# Patient Record
Sex: Female | Born: 1935 | Race: White | Hispanic: No | Marital: Married | Smoking: Never smoker
Health system: Southern US, Community
[De-identification: ages and names within clinical notes are randomized; demographics above are authoritative.]

## PROBLEM LIST (undated history)

## (undated) DIAGNOSIS — I350 Nonrheumatic aortic (valve) stenosis: Secondary | ICD-10-CM

## (undated) DIAGNOSIS — I359 Nonrheumatic aortic valve disorder, unspecified: Secondary | ICD-10-CM

## (undated) DIAGNOSIS — I779 Disorder of arteries and arterioles, unspecified: Secondary | ICD-10-CM

## (undated) DIAGNOSIS — I10 Essential (primary) hypertension: Secondary | ICD-10-CM

## (undated) DIAGNOSIS — R42 Dizziness and giddiness: Secondary | ICD-10-CM

## (undated) DIAGNOSIS — E785 Hyperlipidemia, unspecified: Secondary | ICD-10-CM

## (undated) DIAGNOSIS — N183 Chronic kidney disease, stage 3 unspecified: Secondary | ICD-10-CM

## (undated) DIAGNOSIS — F329 Major depressive disorder, single episode, unspecified: Secondary | ICD-10-CM

## (undated) DIAGNOSIS — K219 Gastro-esophageal reflux disease without esophagitis: Secondary | ICD-10-CM

## (undated) DIAGNOSIS — I739 Peripheral vascular disease, unspecified: Secondary | ICD-10-CM

## (undated) DIAGNOSIS — F32A Depression, unspecified: Secondary | ICD-10-CM

## (undated) DIAGNOSIS — E539 Vitamin B deficiency, unspecified: Secondary | ICD-10-CM

## (undated) DIAGNOSIS — I701 Atherosclerosis of renal artery: Secondary | ICD-10-CM

## (undated) DIAGNOSIS — I672 Cerebral atherosclerosis: Secondary | ICD-10-CM

## (undated) DIAGNOSIS — I6529 Occlusion and stenosis of unspecified carotid artery: Secondary | ICD-10-CM

## (undated) HISTORY — DX: Depression, unspecified: F32.A

## (undated) HISTORY — DX: Chronic kidney disease, stage 3 unspecified: N18.30

## (undated) HISTORY — DX: Nonrheumatic aortic (valve) stenosis: I35.0

## (undated) HISTORY — DX: Vitamin B deficiency, unspecified: E53.9

## (undated) HISTORY — DX: Chronic kidney disease, stage 3 (moderate): N18.3

## (undated) HISTORY — DX: Nonrheumatic aortic valve disorder, unspecified: I35.9

## (undated) HISTORY — DX: Peripheral vascular disease, unspecified: I73.9

## (undated) HISTORY — DX: Major depressive disorder, single episode, unspecified: F32.9

## (undated) HISTORY — PX: APPENDECTOMY: SHX54

## (undated) HISTORY — DX: Dizziness and giddiness: R42

## (undated) HISTORY — DX: Gastro-esophageal reflux disease without esophagitis: K21.9

## (undated) HISTORY — PX: CHOLECYSTECTOMY: SHX55

## (undated) HISTORY — DX: Atherosclerosis of renal artery: I70.1

## (undated) HISTORY — DX: Disorder of arteries and arterioles, unspecified: I77.9

## (undated) HISTORY — PX: ABDOMINAL HYSTERECTOMY: SHX81

## (undated) HISTORY — DX: Occlusion and stenosis of unspecified carotid artery: I65.29

## (undated) HISTORY — DX: Essential (primary) hypertension: I10

## (undated) HISTORY — DX: Hyperlipidemia, unspecified: E78.5

## (undated) HISTORY — DX: Cerebral atherosclerosis: I67.2

---

## 2004-07-30 ENCOUNTER — Ambulatory Visit (HOSPITAL_COMMUNITY): Admission: RE | Admit: 2004-07-30 | Discharge: 2004-07-31 | Payer: Self-pay | Admitting: Ophthalmology

## 2011-07-21 ENCOUNTER — Other Ambulatory Visit: Payer: Self-pay | Admitting: Family Medicine

## 2011-07-21 DIAGNOSIS — I10 Essential (primary) hypertension: Secondary | ICD-10-CM

## 2011-07-21 DIAGNOSIS — N289 Disorder of kidney and ureter, unspecified: Secondary | ICD-10-CM

## 2011-07-24 ENCOUNTER — Ambulatory Visit
Admission: RE | Admit: 2011-07-24 | Discharge: 2011-07-24 | Disposition: A | Discharge status: Home / Self Care | Payer: Medicare Other | Source: Ambulatory Visit | Attending: Family Medicine | Admitting: Family Medicine

## 2011-07-24 ENCOUNTER — Other Ambulatory Visit: Payer: Self-pay | Admitting: Family Medicine

## 2011-07-24 DIAGNOSIS — I10 Essential (primary) hypertension: Secondary | ICD-10-CM

## 2011-07-24 DIAGNOSIS — R110 Nausea: Secondary | ICD-10-CM

## 2011-07-24 DIAGNOSIS — N289 Disorder of kidney and ureter, unspecified: Secondary | ICD-10-CM

## 2011-07-27 ENCOUNTER — Ambulatory Visit
Admission: RE | Admit: 2011-07-27 | Discharge: 2011-07-27 | Disposition: A | Discharge status: Home / Self Care | Payer: Medicare Other | Source: Ambulatory Visit | Attending: Family Medicine | Admitting: Family Medicine

## 2011-07-27 ENCOUNTER — Other Ambulatory Visit: Payer: Self-pay | Admitting: Family Medicine

## 2011-07-27 DIAGNOSIS — R110 Nausea: Secondary | ICD-10-CM

## 2011-07-27 MED ORDER — IOHEXOL 300 MG/ML  SOLN
100.0000 mL | Freq: Once | INTRAMUSCULAR | Status: AC | PRN
  Administered 2011-07-27: 100 mL via INTRAVENOUS

## 2013-11-30 ENCOUNTER — Other Ambulatory Visit: Payer: Self-pay | Admitting: Family Medicine

## 2013-11-30 DIAGNOSIS — I739 Peripheral vascular disease, unspecified: Secondary | ICD-10-CM

## 2013-11-30 DIAGNOSIS — I779 Disorder of arteries and arterioles, unspecified: Secondary | ICD-10-CM

## 2013-11-30 DIAGNOSIS — G44009 Cluster headache syndrome, unspecified, not intractable: Secondary | ICD-10-CM

## 2013-12-11 ENCOUNTER — Ambulatory Visit
Admission: RE | Admit: 2013-12-11 | Discharge: 2013-12-11 | Disposition: A | Discharge status: Home / Self Care | Payer: Medicare Other | Source: Ambulatory Visit | Attending: Family Medicine | Admitting: Family Medicine

## 2013-12-11 DIAGNOSIS — I739 Peripheral vascular disease, unspecified: Secondary | ICD-10-CM

## 2013-12-11 DIAGNOSIS — I779 Disorder of arteries and arterioles, unspecified: Secondary | ICD-10-CM

## 2013-12-11 DIAGNOSIS — G44009 Cluster headache syndrome, unspecified, not intractable: Secondary | ICD-10-CM

## 2014-06-12 ENCOUNTER — Encounter: Payer: Self-pay | Admitting: *Deleted

## 2015-08-28 ENCOUNTER — Encounter: Payer: Self-pay | Admitting: Neurology

## 2015-08-28 ENCOUNTER — Ambulatory Visit (INDEPENDENT_AMBULATORY_CARE_PROVIDER_SITE_OTHER): Payer: Medicare Other | Admitting: Neurology

## 2015-08-28 VITALS — BP 118/80 | HR 80 | Ht 59.0 in | Wt 131.6 lb

## 2015-08-28 DIAGNOSIS — G3184 Mild cognitive impairment, so stated: Secondary | ICD-10-CM

## 2015-08-28 NOTE — Progress Notes (Signed)
NEUROLOGY CONSULTATION NOTE  Kaitlyn Clark MRN: [id number] DOB: 16-Oct-1924  Referring provider: Dr. Stephanie Acre Primary care provider: Dr. Stephanie Acre  Reason for consult:  dementia  HISTORY OF PRESENT ILLNESS: Kaitlyn Clark is an 82 year old right-handed female with chronic kidney disease, hyperlipidemia, depression, renal artery stenosis, CAD, hypertension who presents for dementia.  History obtained by patient and daughter.  Labs and imaging of brain MRI reviewed.  She went to school through the 11th grade.  Over the past couple of years, she has had short-term memory problems.  She often misplaces objects, such as money or her insurance cards.  Sometimes, she was reportedly mixing up her medications.  While her daughter pays most of the bills (such as the utility bills), she still pays the mortgage and the [employer].  She maybe missed a payment only once.  She was still driving up until this past Fall, mostly because she doesn't feel well.  Usually she has family that drives her to get groceries.  One morning, she called her daughter asking where she was.  She told her daughter she had prepared breakfast for her but she had left.  Apparently, she got confused and remembered that her daughter had visited her the previous week.  She currently lives in her own home.  However, her other daughter lives in the area.  Her son-in-law comes by the house everyday to help out..  Her grandson stays with her at night.  She performs all of her ADLs, including dressing and bathing.  She is not incontinent.  She requires help cleaning the house due to generalized weakness.  She cooks a little bit and never leaves on the stove.  Her sleep is poor, but she is not confused or combative and does not wander out of the house.  She does take short walks around the neighborhood for exercise without getting lost.  There is no family history of dementia.  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  She receives B12 shots.  B12 from 07/10/14 was 656.  LDL was 139.  TSH from 11/30/13 was 1.43.   Carotid doppler from 12/11/13 showed atherosclerotic plaque in both carotid bifurcations but no hemodynamically significant stenosis.    She was told she should no longer drive.  PAST MEDICAL HISTORY: Past Medical History  Diagnosis Date  . Hypertension   . GERD (gastroesophageal reflux disease)   . CKD (chronic kidney disease), stage III   . Depression   . Aortic stenosis, mild   . Carotid artery stenosis     bilateral  . Vertigo   . Aortic valve disorder   . Hyperlipidemia   . Renal artery stenosis ([place])   . Cerebral atherosclerosis   . Carotid arterial disease ([place])   . Vitamin B deficiency     PAST SURGICAL HISTORY: No past surgical history on file.  MEDICATIONS: Current Outpatient Prescriptions on File Prior to Visit  Medication Sig Dispense Refill  . aspirin 81 MG tablet Take 81 mg by mouth daily.    . hydrochlorothiazide (MICROZIDE) 12.5 MG capsule Take 12.5 mg by mouth daily.    . MULTIPLE VITAMIN PO Take by mouth daily.     No current facility-administered medications on file prior to visit.    ALLERGIES: No Known Allergies  FAMILY  HISTORY: Family History  Problem Relation Age of Onset  . Heart attack Father   . Cancer Father   . CAD Father   . Colon cancer Neg Hx   . Colon polyps Neg Hx   . Liver disease Neg Hx     SOCIAL HISTORY: Social History   Social History  . Marital Status: Married    Spouse Name: N/A  . Number of Children: N/A  . Years of Education: N/A   Occupational History  . Not on file.   Social History Main Topics  . Smoking status: Never Smoker   . Smokeless tobacco: Never Used  . Alcohol Use: 0.0 oz/week    0 Standard drinks or equivalent per  week  . Drug Use: No  . Sexual Activity: Not on file   Other Topics Concern  . Not on file   Social History Narrative    REVIEW OF SYSTEMS: Constitutional: No fevers, chills, or sweats, no generalized fatigue, change in appetite Eyes: No visual changes, double vision, eye pain Ear, nose and throat: No hearing loss, ear pain, nasal congestion, sore throat Cardiovascular: No chest pain, palpitations Respiratory:  No shortness of breath at rest or with exertion, wheezes GastrointestinaI: No nausea, vomiting, diarrhea, abdominal pain, fecal incontinence Genitourinary:  No dysuria, urinary retention or frequency Musculoskeletal:  No neck pain, back pain Integumentary: No rash, pruritus, skin lesions Neurological: as above Psychiatric: No depression, insomnia, anxiety Endocrine: No palpitations, fatigue, diaphoresis, mood swings, change in appetite, change in weight, increased thirst Hematologic/Lymphatic:  No anemia, purpura, petechiae. Allergic/Immunologic: no itchy/runny eyes, nasal congestion, recent allergic reactions, rashes  PHYSICAL EXAM: Filed Vitals:   08/28/15 1436  BP: 118/80  Pulse: 80   General: No acute distress.  Patient appears well-groomed.  Head:  Normocephalic/atraumatic Eyes:  fundi examined but not visualized Neck: supple, no paraspinal tenderness, full range of motion Back: No paraspinal tenderness Heart: regular rate and rhythm Lungs: Clear to auscultation bilaterally. Vascular: No carotid bruits. Neurological Exam: Mental status: alert and oriented to person and place but not time (except month), recent memory poor, remote memory intact, fund of knowledge fair, attention and concentration intact, speech fluent and not dysarthric, language intact. Montreal Cognitive Assessment  08/28/2015  Visuospatial/ Executive (0/5) 2  Naming (0/3) 2  Attention: Read list of digits (0/2) 2  Attention: Read list of letters (0/1) 1  Attention: Serial 7 subtraction  starting at 100 (0/3) 2  Language: Repeat phrase (0/2) 0  Language : Fluency (0/1) 0  Abstraction (0/2) 1  Delayed Recall (0/5) 0  Orientation (0/6) 3  Total 13  Adjusted Score (based on education) 14   Cranial nerves: CN I: not tested CN II: pupils equal, round and reactive to light, visual fields intact CN III, IV, VI:  full range of motion, no nystagmus, no ptosis CN V: facial sensation intact CN VII: upper and lower face symmetric CN VIII: hearing intact CN IX, X: gag intact, uvula midline CN XI: sternocleidomastoid and trapezius muscles intact CN XII: tongue midline Bulk & Tone: normal, no fasciculations. Motor:  5/5 throughout  Sensation: temperature and vibration sensation intact. Deep Tendon Reflexes:  2+ throughout, toes downgoing. Finger to nose testing:  Without dysmetria.  Heel to shin:  Without dysmetria.  Gait:  Unsteady without walker.  Able to turn and unable to tandem walk. Romberg with sway.  IMPRESSION: Possible mild cognitive impairment, but I wouldn't start any disease modifying medication such as Aricept or Namenda as I feel side  effects would outweigh benefits.  Although she has some lapse in memory, including the episode when she thought her daughter was in the house, but she is not combative or agitated.  Safety measures seem to be in place at home.  She has family around her constantly, who take her shopping and monitor her medication.  I agree that she should not drive.  She would like to undergo formal occupational driving evaluation, and we will provide her information on somebody.  Otherwise, follow up as needed.  Thank you for allowing me to take part in the care of this patient.  Metta Clines, DO  CC: Jonathon Jordan, MD

## 2015-08-28 NOTE — Patient Instructions (Signed)
I think you may have some mild cognitive impairment, but I don't appreciate any evidence of a progressive disease such as Alzheimer's.  I certainly wouldn't start any medication specifically for dementia.  The main thing is to focus on addressing all safety measures, such as 24 hour supervision at home.  Medication should be monitored.  I would try to keep socially active and stimulated, engaging with others.  I would continue regular walks.  I agree that you should not be driving but you should get a formal driving evaluation.  We will provide you with the number and information for one occupational therapist driving evaluator.  Call with any questions or concerns.

## 2015-08-29 NOTE — Progress Notes (Signed)
Chart forwarded.  

## 2016-01-15 DIAGNOSIS — I10 Essential (primary) hypertension: Secondary | ICD-10-CM

## 2016-01-15 HISTORY — DX: Essential (primary) hypertension: I10

## 2019-03-06 ENCOUNTER — Encounter: Payer: Self-pay | Admitting: *Deleted

## 2019-03-09 ENCOUNTER — Encounter: Payer: Self-pay | Admitting: Radiation Oncology

## 2019-03-09 NOTE — Progress Notes (Signed)
Histology and Location of Primary Skin Cancer:  01/26/19   Kaitlyn Clark presented with the following signs/symptoms: Itching and irritation.   Past/Anticipated interventions by patient's surgeon/dermatologist for current problematic lesion, if any:  She was referred for Moh's procedure on 01/26/19 by [facility].  03/02/19 Dr. Winifred Olive   Past skin cancers, if any: Her daughter denies.   1) Location/Histology/Intervention:   2) Location/Histology/Intervention:   3) Location/Histology/Intervention:   History of Blistering sunburns, if any: Yes  SAFETY ISSUES:  Prior radiation? No  Pacemaker/ICD? No  Possible current pregnancy? No  Is the patient on methotrexate? No  Current Complaints / other details:   She is currently living with her daughter in [place]. She is supposed to move with her other daughter in [place] for  8-10 weeks (they alternate caring for their mother)

## 2019-03-14 ENCOUNTER — Ambulatory Visit
Admission: RE | Admit: 2019-03-14 | Discharge: 2019-03-14 | Disposition: A | Discharge status: Home / Self Care | Payer: Medicare Other | Source: Ambulatory Visit | Attending: Radiation Oncology | Admitting: Radiation Oncology

## 2019-03-14 ENCOUNTER — Encounter: Payer: Self-pay | Admitting: *Deleted

## 2019-03-14 ENCOUNTER — Other Ambulatory Visit: Payer: Self-pay

## 2019-03-14 ENCOUNTER — Encounter: Payer: Self-pay | Admitting: Radiation Oncology

## 2019-03-14 ENCOUNTER — Telehealth: Payer: Self-pay | Admitting: *Deleted

## 2019-03-14 VITALS — BP 152/59 | HR 62 | Temp 98.3°F | Resp 18 | Wt 131.5 lb

## 2019-03-14 DIAGNOSIS — Z8249 Family history of ischemic heart disease and other diseases of the circulatory system: Secondary | ICD-10-CM | POA: Insufficient documentation

## 2019-03-14 DIAGNOSIS — Z79899 Other long term (current) drug therapy: Secondary | ICD-10-CM | POA: Insufficient documentation

## 2019-03-14 DIAGNOSIS — F0390 Unspecified dementia without behavioral disturbance: Secondary | ICD-10-CM | POA: Insufficient documentation

## 2019-03-14 DIAGNOSIS — N183 Chronic kidney disease, stage 3 unspecified: Secondary | ICD-10-CM | POA: Insufficient documentation

## 2019-03-14 DIAGNOSIS — E785 Hyperlipidemia, unspecified: Secondary | ICD-10-CM | POA: Diagnosis not present

## 2019-03-14 DIAGNOSIS — E538 Deficiency of other specified B group vitamins: Secondary | ICD-10-CM | POA: Diagnosis not present

## 2019-03-14 DIAGNOSIS — C4441 Basal cell carcinoma of skin of scalp and neck: Secondary | ICD-10-CM

## 2019-03-14 DIAGNOSIS — Z7982 Long term (current) use of aspirin: Secondary | ICD-10-CM | POA: Insufficient documentation

## 2019-03-14 DIAGNOSIS — I10 Essential (primary) hypertension: Secondary | ICD-10-CM | POA: Diagnosis not present

## 2019-03-14 DIAGNOSIS — Z90710 Acquired absence of both cervix and uterus: Secondary | ICD-10-CM | POA: Insufficient documentation

## 2019-03-14 HISTORY — DX: Basal cell carcinoma of skin of scalp and neck: C44.41

## 2019-03-14 NOTE — Progress Notes (Signed)
Radiation Oncology         [phone] ________________________________  Initial outpatient Consultation in person  Name: Kaitlyn Clark MRN: [id number]  Date: 03/14/2019  DOB: 09-23-1924  CC:Jonathon Jordan, MD  Karin Golden, MD   REFERRING PHYSICIAN: Karin Golden, MD  DIAGNOSIS:    ICD-10-CM   1. Basal cell carcinoma (BCC) of scalp  C44.41   2. Basal cell carcinoma of scalp  C44.41   Cancer Staging Basal cell carcinoma of scalp Staging form: Cutaneous Carcinoma of the Head and Neck, AJCC 8th Edition - Clinical stage from 03/14/2019: Stage III (cT3, cN0, cM0) - Signed by Eppie Gibson, MD on 03/14/2019   CHIEF COMPLAINT: Here to discuss management of skin cancer  HISTORY OF PRESENT ILLNESS::Kaitlyn Clark is a 85 y.o. female who presented with recurrent scalp lesion. She has had the lesion for approximately 8 years. It was biopsied in 07/2014 and showed basal cell carcinoma. At that time, the lesion was treated with topicort spray. Per [employer], PA-C's note, she was treated with LN2 on 10/16/2016, 01/12/2017, and 02/01/2018. Repeat biopsy performed on 01/26/2019 showed basal cell carcinoma, nodular pattern, ulcerated, deep margin involved. She was referred for Moh's.  In light of the patient's dementia, her daughter Vaughan Basta, who is her POA, has been the primary Media planner. Vaughan Basta expressed concern for the patient's ability to tolerate Moh's and opted to pursue radiation treatment.  Of note the patient also underwent shave biopsy of the left tip of her nose in the end of September - January 26, 2019 and does not know of any follow-up yet with dermatology although it appears that she was to be seen for follow-up in 6 weeks after the procedure.  This was conducted by Dr. Juel Burrow physicians assistant.  See pathology report below   The patient has: Itching and irritation over her scalp.  Due to her dementia she will repeatedly itch or even rub her hair brush over her scalp  which irritates it further  The patient's 3 daughters take turns caring for her every several weeks.  The patient is tentatively going to [facility] later this month to be with her other daughter  PREVIOUS RADIATION THERAPY: No  PAST MEDICAL HISTORY:  has a past medical history of Aortic stenosis, mild, Aortic valve disorder, Carotid arterial disease ([place]), Carotid artery stenosis, Cerebral atherosclerosis, CKD (chronic kidney disease), stage III, Depression, GERD (gastroesophageal reflux disease), Hyperlipidemia, Hypertension, Renal artery stenosis ([place]), Vertigo, and Vitamin B deficiency.    PAST SURGICAL HISTORY: Past Surgical History:  Procedure Laterality Date  . ABDOMINAL HYSTERECTOMY    . APPENDECTOMY    . CHOLECYSTECTOMY      FAMILY HISTORY: family history includes CAD in her father; Cancer in her father; Heart attack in her father.  SOCIAL HISTORY:  reports that she has never smoked. She has never used smokeless tobacco. She reports previous alcohol use. She reports that she does not use drugs.  ALLERGIES: Patient has no known allergies.  MEDICATIONS:  Current Outpatient Medications  Medication Sig Dispense Refill  . amLODipine (NORVASC) 2.5 MG tablet amlodipine 2.5 mg tablet  TK 1 T PO QD    . aspirin 81 MG tablet Take 81 mg by mouth daily.    . cyanocobalamin (,VITAMIN B-12,) 1000 MCG/ML injection INJECT 1ML IM Q MONTH  4  . loratadine (CLARITIN) 10 MG tablet Claritin 10 mg tablet  Take 1 tablet every day by oral route.    . propranolol (INDERAL) 10 MG  tablet propranolol 10 mg tablet  TK 1 T PO BID OES    . VITAMIN D, CHOLECALCIFEROL, PO Take by mouth.    . imiquimod (ALDARA) 5 % cream imiquimod 5 % topical cream packet     No current facility-administered medications for this encounter.     REVIEW OF SYSTEMS:  Notable for that above.   PHYSICAL EXAM:  weight is 131 lb 8 oz (59.6 kg). Her temporal temperature is 98.3 F (36.8 C). Her blood pressure  is 152/59 (abnormal) and her pulse is 62. Her respiration is 18 and oxygen saturation is 98%.    Over the patient's scalp, the crown of her scalp, there is a textured erythematous lesion with dried blood that measures at least 5 cm in dimension  There is an area of erythema over the tip of her nose that may be a healed site where she underwent shave biopsy of another lesion.  Unclear if there is still active disease in this area  No palpable adenopathy in the pre or postauricular regions or in the neck bilaterally  The patient presents in a wheelchair and she has a resting tremor of her head.  She does not provide a history.  LABORATORY DATA:  No results found for: WBC, HGB, HCT, MCV, PLT CMP  No results found for: NA, K, CL, CO2, GLUCOSE, BUN, CREATININE, CALCIUM, PROT, ALBUMIN, AST, ALT, ALKPHOS, BILITOT, GFRNONAA, GFRAA       RADIOGRAPHY: No results found.    IMPRESSION/PLAN: Today, I talked to the patient and her family about the findings and work-up thus far. We discussed the patient's diagnosis of skin cancer and general treatment for this, highlighting the role of radiotherapy in the management. We discussed the available radiation techniques, and focused on the details of logistics and delivery.    Due to the patient's age and comorbidities I recommend a hypofractionated regimen.  This could either be 5-day a week treatment for 4 weeks or twice a week treatment for 5 weeks.  Due to the patient's family circumstances and need for continuous care from her daughters I think it is reasonable for her to move back to [facility] as planned and to pursue treatment in [place].  I spoke with her daughter over speaker phone who lives in [place] and it sounds like she has good options there for medical care.  The family is going to think about their options.  I am available if they choose to pursue treatment here.  I also recommended that they follow-up  with Dr. Juel Burrow clinic as soon as possible to verify if the lesion on her nose has been cured or if it requires adjuvant radiation.  If that is the case we can treat her scalp and her nose at the same time  The family is very appreciative of our discussion today. We discussed the risks, benefits, and side effects of radiotherapy. Side effects may include but not necessarily be limited to: Skin irritation/injury, bleeding, and hair loss and fatigue.  No guarantees of treatment were given.    The family has her contact information to get in touch with [place] if they choose to get treatment here though it seems that they are leaning towards pursuing this in [place].  I spent 45 minutes  face to face with the patient and more than 50% of that time was spent in counseling and/or coordination of care. Gayleen Orem, RN, our Head and Neck Oncology Navigator was present, the patient and  her daughter were present, and her other daughter was on speaker phone.    __________________________________________   Eppie Gibson, MD  This document serves as a record of services personally performed by Eppie Gibson, MD. It was created on her behalf by Wilburn Mylar, a trained medical scribe. The creation of this record is based on the scribe's personal observations and the provider's statements to them. This document has been checked and approved by the attending provider.

## 2019-03-14 NOTE — Progress Notes (Signed)
Oncology Nurse Navigator Documentation  Met with Kaitlyn Clark and her dtr Linda during Consult with Dr. Squire.  Linda accompanied her mother d/t her pronounced dementia.  Linda voiced understanding of discussion re benefit of RT benefit vs surgery and associated morbidities.  Linda indicated tentative plan is for her mother to stay with sister Kathy in [place], [place] for 8-10 weeks as part of the families' shared care giving.  Her mother wd likely be treated at local cancer center.  Linda voiced understanding of RT tmt plan options which were explained by Dr. Squire to Kathy over speaker phone  Linda agreed to arrange recommended follow-up with dermatologist to determine his recommendation for RT to recently treated nose BCC understanding this is important information to have regardless of where her mother receives tmt.  Linda agree to call me pending further family discussion and decision to have tmt at [facility].  Rick , RN, BSN Head & Neck Oncology Nurse Navigator [facility] at [place] [phone]  

## 2019-03-14 NOTE — Telephone Encounter (Signed)
Oncology Nurse Navigator Documentation  Placed introductory call to new referral patient KaitlynKaitlyn Clark, spoke with dtr Kaitlyn Clark as Kaitlyn Clark has severe dementia.  Introduced myself as the H&N oncology nurse navigator that works with Dr. Isidore Moos to whom Kaitlyn Clark has been referred by Dr. Winifred Olive.  She confirmed understanding of referral, this afternoons' in-person 2:30 NE and 3:00 Consult with Dr. Isidore Moos.  Briefly explained my role as their navigator, indicated I would be joining them this afternoon at which time I will further discuss my role as a member of the Care Team.  Confirmed understanding of [place] location, explained arrival and registration process.  Gayleen Orem, RN, BSN Head & Neck Oncology Nurse [place] at [place] [phone]

## 2019-03-23 ENCOUNTER — Telehealth: Payer: Self-pay | Admitting: *Deleted

## 2019-03-23 NOTE — Telephone Encounter (Signed)
Oncology Nurse Navigator Documentation  Called pt's dtr Vaughan Basta in follow-up to 11/10 Consult with Dr. Isidore Moos at which time dtr was unsure when her mother would be moving to [place], [place], to stay with another dtr.  She indicated her mother moved the [place] this past Sunday, has already had consult with RadOnc at institution with which her sister is associated.  She confirmed her mother had f/u with dermatologist earlier this week per discussion with Dr. Isidore Moos, was told her nose "looked fine", did not need further tmt.  I indicated I would provide Dr. Isidore Moos with update.    Gayleen Orem, RN, BSN Head & Neck Oncology Nurse [place] at [place] [phone]

## 2020-10-08 DIAGNOSIS — U071 COVID-19: Secondary | ICD-10-CM | POA: Insufficient documentation

## 2020-10-31 ENCOUNTER — Emergency Department (HOSPITAL_BASED_OUTPATIENT_CLINIC_OR_DEPARTMENT_OTHER): Payer: Medicare Other

## 2020-10-31 ENCOUNTER — Encounter (HOSPITAL_BASED_OUTPATIENT_CLINIC_OR_DEPARTMENT_OTHER): Payer: Self-pay

## 2020-10-31 ENCOUNTER — Other Ambulatory Visit: Payer: Self-pay

## 2020-10-31 DIAGNOSIS — I5032 Chronic diastolic (congestive) heart failure: Secondary | ICD-10-CM | POA: Diagnosis present

## 2020-10-31 DIAGNOSIS — Z515 Encounter for palliative care: Secondary | ICD-10-CM

## 2020-10-31 DIAGNOSIS — Z85828 Personal history of other malignant neoplasm of skin: Secondary | ICD-10-CM

## 2020-10-31 DIAGNOSIS — Z9049 Acquired absence of other specified parts of digestive tract: Secondary | ICD-10-CM

## 2020-10-31 DIAGNOSIS — Z8249 Family history of ischemic heart disease and other diseases of the circulatory system: Secondary | ICD-10-CM

## 2020-10-31 DIAGNOSIS — F32A Depression, unspecified: Secondary | ICD-10-CM | POA: Diagnosis present

## 2020-10-31 DIAGNOSIS — E782 Mixed hyperlipidemia: Secondary | ICD-10-CM | POA: Diagnosis present

## 2020-10-31 DIAGNOSIS — Z79899 Other long term (current) drug therapy: Secondary | ICD-10-CM

## 2020-10-31 DIAGNOSIS — C4441 Basal cell carcinoma of skin of scalp and neck: Secondary | ICD-10-CM | POA: Diagnosis present

## 2020-10-31 DIAGNOSIS — Z66 Do not resuscitate: Secondary | ICD-10-CM | POA: Diagnosis present

## 2020-10-31 DIAGNOSIS — I214 Non-ST elevation (NSTEMI) myocardial infarction: Secondary | ICD-10-CM | POA: Diagnosis not present

## 2020-10-31 DIAGNOSIS — F0390 Unspecified dementia without behavioral disturbance: Secondary | ICD-10-CM | POA: Diagnosis present

## 2020-10-31 DIAGNOSIS — E876 Hypokalemia: Secondary | ICD-10-CM | POA: Diagnosis present

## 2020-10-31 DIAGNOSIS — U071 COVID-19: Secondary | ICD-10-CM | POA: Diagnosis present

## 2020-10-31 DIAGNOSIS — I130 Hypertensive heart and chronic kidney disease with heart failure and stage 1 through stage 4 chronic kidney disease, or unspecified chronic kidney disease: Secondary | ICD-10-CM | POA: Diagnosis present

## 2020-10-31 DIAGNOSIS — N1832 Chronic kidney disease, stage 3b: Secondary | ICD-10-CM | POA: Diagnosis present

## 2020-10-31 DIAGNOSIS — I10 Essential (primary) hypertension: Secondary | ICD-10-CM | POA: Diagnosis present

## 2020-10-31 DIAGNOSIS — I5043 Acute on chronic combined systolic (congestive) and diastolic (congestive) heart failure: Secondary | ICD-10-CM | POA: Diagnosis present

## 2020-10-31 DIAGNOSIS — Z7982 Long term (current) use of aspirin: Secondary | ICD-10-CM

## 2020-10-31 DIAGNOSIS — D72829 Elevated white blood cell count, unspecified: Secondary | ICD-10-CM | POA: Diagnosis present

## 2020-10-31 DIAGNOSIS — I672 Cerebral atherosclerosis: Secondary | ICD-10-CM | POA: Diagnosis present

## 2020-10-31 DIAGNOSIS — I350 Nonrheumatic aortic (valve) stenosis: Secondary | ICD-10-CM | POA: Diagnosis present

## 2020-10-31 DIAGNOSIS — K219 Gastro-esophageal reflux disease without esophagitis: Secondary | ICD-10-CM | POA: Diagnosis present

## 2020-10-31 DIAGNOSIS — Z809 Family history of malignant neoplasm, unspecified: Secondary | ICD-10-CM

## 2020-10-31 DIAGNOSIS — Z90710 Acquired absence of both cervix and uterus: Secondary | ICD-10-CM

## 2020-10-31 LAB — D-DIMER, QUANTITATIVE: D-Dimer, Quant: 1.5 ug/mL-FEU — ABNORMAL HIGH (ref 0.00–0.50)

## 2020-10-31 LAB — COMPREHENSIVE METABOLIC PANEL
ALT: 22 U/L (ref 0–44)
AST: 103 U/L — ABNORMAL HIGH (ref 15–41)
Albumin: 4.2 g/dL (ref 3.5–5.0)
Alkaline Phosphatase: 91 U/L (ref 38–126)
Anion gap: 14 (ref 5–15)
BUN: 16 mg/dL (ref 8–23)
CO2: 25 mmol/L (ref 22–32)
Calcium: 9.8 mg/dL (ref 8.9–10.3)
Chloride: 99 mmol/L (ref 98–111)
Creatinine, Ser: 1.3 mg/dL — ABNORMAL HIGH (ref 0.44–1.00)
GFR, Estimated: 38 mL/min — ABNORMAL LOW (ref >60)
Glucose, Bld: 130 mg/dL — ABNORMAL HIGH (ref 70–99)
Potassium: 3.3 mmol/L — ABNORMAL LOW (ref 3.5–5.1)
Sodium: 138 mmol/L (ref 135–145)
Total Bilirubin: 1 mg/dL (ref 0.3–1.2)
Total Protein: 7.1 g/dL (ref 6.5–8.1)

## 2020-10-31 LAB — CBC
HCT: 36.6 % (ref 36.0–46.0)
Hemoglobin: 12.4 g/dL (ref 12.0–15.0)
MCH: 30.2 pg (ref 26.0–34.0)
MCHC: 33.9 g/dL (ref 30.0–36.0)
MCV: 89.1 fL (ref 80.0–100.0)
Platelets: 263 10*3/uL (ref 150–400)
RBC: 4.11 MIL/uL (ref 3.87–5.11)
RDW: 15.2 % (ref 11.5–15.5)
WBC: 19.6 10*3/uL — ABNORMAL HIGH (ref 4.0–10.5)
nRBC: 0.1 % (ref 0.0–0.2)

## 2020-10-31 LAB — TROPONIN I (HIGH SENSITIVITY)
Troponin I (High Sensitivity): 22983 ng/L (ref <18)
Troponin I (High Sensitivity): 22496 ng/L (ref <18)

## 2020-10-31 LAB — BRAIN NATRIURETIC PEPTIDE: B Natriuretic Peptide: 1391.4 pg/mL — ABNORMAL HIGH (ref 0.0–100.0)

## 2020-10-31 MED ORDER — IOHEXOL 350 MG/ML SOLN
100.0000 mL | Freq: Once | INTRAVENOUS | Status: AC | PRN
  Administered 2020-10-31: 50 mL via INTRAVENOUS

## 2020-10-31 MED ORDER — ASPIRIN 81 MG PO CHEW
324.0000 mg | CHEWABLE_TABLET | Freq: Once | ORAL | Status: AC
  Administered 2020-10-31: 324 mg via ORAL
  Filled 2020-10-31: qty 4

## 2020-10-31 MED ORDER — OXYCODONE-ACETAMINOPHEN 5-325 MG PO TABS
2.0000 | ORAL_TABLET | Freq: Once | ORAL | Status: DC
Start: 2020-10-31 — End: 2020-10-31

## 2020-10-31 MED ORDER — ENOXAPARIN SODIUM 60 MG/0.6ML IJ SOSY
1.0000 mg/kg | PREFILLED_SYRINGE | Freq: Once | INTRAMUSCULAR | Status: AC
  Administered 2020-10-31: 55 mg via SUBCUTANEOUS
  Filled 2020-10-31: qty 0.6

## 2020-10-31 MED ORDER — SODIUM CHLORIDE 0.9 % IV BOLUS
500.0000 mL | Freq: Once | INTRAVENOUS | Status: AC
  Administered 2020-10-31: 500 mL via INTRAVENOUS

## 2020-10-31 NOTE — ED Notes (Addendum)
Pt O2 sat reading low due to it not being on correctly. Pt O2 sat 100% RA

## 2020-10-31 NOTE — ED Notes (Signed)
Nasal cannula placed at 2L as pt's O2 sat continues to drop randomly.   Pt's 2 daughters at bedside. Ok per EDP.

## 2020-10-31 NOTE — ED Notes (Signed)
Patient transported to CT 

## 2020-10-31 NOTE — ED Provider Notes (Addendum)
[facility] DEPT Provider Note   CSN: [id number] Arrival date & time: 10/27/2020  1928     History Chief Complaint  Patient presents with  . Shortness of Breath  . Chest Pain    Kaitlyn Clark is a 87 y.o. female.  Kaitlyn Clark presents with her daughter.  She is visiting her daughter from [place].  This morning, the patient did not eat a full breakfast.  Her daughter had gotten her [employer] biscuit and gravy, and she usually loves it.  She managed to take her morning medicines with some cranberry juice.  However, she had an episode of vomiting.  Since that time, she has not been herself.  She has not wanted to eat.  She has been appearing to be short of breath off and on.  She endorsed some chest pain later this evening when asked by another daughter.  However, the history is very limited because she does have very advanced dementia.  She did have COVID-19 about a month ago and received monoclonal antibody infusion.  She seemed to do well, but the daughter noticed some coughing last night.  The history is provided by a relative (daughter). The history is limited by the condition of the patient (severe dementia).  Shortness of Breath Severity:  Moderate Onset quality:  Gradual Duration:  12 hours Timing:  Intermittent Progression:  Waxing and waning Chronicity:  New Context: not URI   Relieved by:  Nothing Worsened by:  Nothing Ineffective treatments:  None tried Associated symptoms: chest pain and vomiting   Associated symptoms: no abdominal pain, no cough, no ear pain, no fever, no rash and no sore throat       Past Medical History:  Diagnosis Date  . Aortic stenosis, mild   . Aortic valve disorder   . Carotid arterial disease ([place])   . Carotid artery stenosis    bilateral  . Cerebral atherosclerosis   . CKD (chronic kidney disease), stage III ([place])   . Depression   . GERD (gastroesophageal reflux disease)   . Hyperlipidemia   .  Hypertension   . Renal artery stenosis ([place])   . Vertigo   . Vitamin B deficiency     Patient Active Problem List   Diagnosis Date Noted  . Basal cell carcinoma of scalp 03/14/2019    Past Surgical History:  Procedure Laterality Date  . ABDOMINAL HYSTERECTOMY    . APPENDECTOMY    . CHOLECYSTECTOMY       OB History   No obstetric history on file.     Family History  Problem Relation Age of Onset  . Heart attack Father   . Cancer Father   . CAD Father   . Colon cancer Neg Hx   . Colon polyps Neg Hx   . Liver disease Neg Hx     Social History   Tobacco Use  . Smoking status: Never  . Smokeless tobacco: Never  Substance Use Topics  . Alcohol use: Not Currently    Alcohol/week: 0.0 standard drinks  . Drug use: No    Home Medications Prior to Admission medications   Medication Sig Start Date End Date Taking? Authorizing Provider  amLODipine (NORVASC) 2.5 MG tablet amlodipine 2.5 mg tablet  TK 1 T PO QD 01/16/16   [provider]  aspirin 81 MG tablet Take 81 mg by mouth daily.    [provider]  cyanocobalamin (,VITAMIN B-12,) 1000 MCG/ML injection INJECT 1ML IM Q MONTH 07/09/15  [provider]  imiquimod (ALDARA) 5 % cream imiquimod 5 % topical cream packet    [provider]  loratadine (CLARITIN) 10 MG tablet Claritin 10 mg tablet  Take 1 tablet every day by oral route.    [provider]  propranolol (INDERAL) 10 MG tablet propranolol 10 mg tablet  TK 1 T PO BID OES    [provider]  VITAMIN D, CHOLECALCIFEROL, PO Take by mouth.    [provider]    Allergies    Patient has no known allergies.  Review of Systems   Review of Systems  Constitutional:  Negative for chills and fever.  HENT:  Negative for ear pain and sore throat.   Eyes:  Negative for pain and visual disturbance.  Respiratory:  Positive for shortness of breath. Negative for cough.   Cardiovascular:  Positive for chest pain.  Negative for palpitations.  Gastrointestinal:  Positive for vomiting. Negative for abdominal pain.  Genitourinary:  Negative for dysuria and hematuria.  Musculoskeletal:  Negative for arthralgias and back pain.  Skin:  Negative for color change and rash.  Neurological:  Negative for seizures and syncope.  Psychiatric/Behavioral:  Positive for confusion.   All other systems reviewed and are negative.  Physical Exam Updated Vital Signs BP (!) 161/78 (BP Location: Left Arm)   Pulse 88   Temp 98.6 F (37 C) (Oral)   Resp 18   Ht 4\' 9"  (1.448 m)   Wt 54.4 kg   SpO2 100%   BMI 25.97 kg/m   Physical Exam Vitals and nursing note reviewed.  Constitutional:      Appearance: She is well-developed.  HENT:     Head: Normocephalic and atraumatic.  Cardiovascular:     Rate and Rhythm: Normal rate and regular rhythm.     Heart sounds: Normal heart sounds.  Pulmonary:     Effort: Pulmonary effort is normal. Tachypnea present.     Breath sounds: Normal breath sounds.     Comments: Patient has periods of tachypnea with respiratory rates into the 30s alternating with periods of respiratory rates in the teens. Abdominal:     Palpations: Abdomen is soft.     Tenderness: There is no abdominal tenderness.  Musculoskeletal:     Right lower leg: No edema.     Left lower leg: No edema.  Skin:    General: Skin is warm and dry.  Neurological:     General: No focal deficit present.     Mental Status: She is alert.     Comments: Able to follow commands All extremities  Psychiatric:        Mood and Affect: Mood normal.        Behavior: Behavior normal.    ED Results / Procedures / Treatments   Labs (all labs ordered are listed, but only abnormal results are displayed) Labs Reviewed  CBC - Abnormal; Notable for the following components:      Result Value   WBC 19.6 (*)    All other components within normal limits  COMPREHENSIVE METABOLIC PANEL  URINALYSIS, ROUTINE W REFLEX MICROSCOPIC   D-DIMER, QUANTITATIVE  TROPONIN I (HIGH SENSITIVITY)    EKG EKG Interpretation  Date/Time:  Thursday October 31 2020 19:36:18 EDT Ventricular Rate:  90 PR Interval:  150 QRS Duration: 81 QT Interval:  367 QTC Calculation: 449 R Axis:   11 Text Interpretation: Sinus rhythm Atrial premature complex Abnormal R-wave progression, early transition Repol abnrm, severe global ischemia (LM/MVD) ST  depression in the V leads especially V2 No priors for comparison Confirmed by Lorre Munroe ([id number]) on 10/08/2020 7:38:31 PM  Radiology No results found.  Procedures .Critical Care  Date/Time: 10/23/2020 11:56 PM Performed by: Arnaldo Natal, MD Authorized by: Arnaldo Natal, MD   Critical care provider statement:    Critical care time (minutes):  60   Critical care time was exclusive of:  Separately billable procedures and treating other patients and teaching time   Critical care was necessary to treat or prevent imminent or life-threatening deterioration of the following conditions:  Cardiac failure and respiratory failure   Critical care was time spent personally by me on the following activities:  Discussions with consultants, evaluation of patient's response to treatment, examination of patient, ordering and performing treatments and interventions, ordering and review of laboratory studies, ordering and review of radiographic studies, pulse oximetry, re-evaluation of patient's condition, obtaining history from patient or surrogate and review of old charts   I assumed direction of critical care for this patient from another provider in my specialty: no     Care discussed with: admitting provider     Medications Ordered in ED Medications  sodium chloride 0.9 % bolus 500 mL (500 mLs Intravenous New Bag/Given 10/15/2020 2312)  enoxaparin (LOVENOX) injection 55 mg (55 mg Subcutaneous Given 10/13/2020 2313)  iohexol (OMNIPAQUE) 350 MG/ML injection 100 mL (50 mLs Intravenous Contrast Given 10/05/2020 2246)   aspirin chewable tablet 324 mg (324 mg Oral Given 10/04/2020 2336)    ED Course  I have reviewed the triage vital signs and the nursing notes.  Pertinent labs & imaging results that were available during my care of the patient were reviewed by me and considered in my medical decision making (see chart for details).  Clinical Course as of 11/01/20 0021  Thu Oct 31, 2020  2300 I spoke to Dr. Paticia Stack with cardiology. Recommends admission to medicine. No emergent need for cath. Agrees with clarifying goals of treatment with family.  [AW]  2301 I spoke with Dr. Nevada Crane of [facility].  [AW]  2355 The patient's daughters are at bedside.  At this point they would not want the patient to undergo CPR. [AW]  Fri Nov 01, 2020  0021 Family would like patient to be DNR/DNI.  They are interested in palliative consult. [AW]    Clinical Course User Index [AW] Arnaldo Natal, MD   MDM Rules/Calculators/A&P                          Roxana Hires came into the hospital with shortness of breath and possibly chest pain.  History was limited because of her severe dementia that prevented her from being able to adequately communicate her symptoms.  She was noted to be hypertensive and tachypneic.  Differential included pneumonia, PE, ACS, CHF, pulmonary effects from her recent COVID infection, intra-abdominal pathology.  She had a markedly elevated but relatively stable troponin.  CT angiogram for PE revealed no evidence of PE.  B natruretic peptide markedly elevated suggesting CHF from her recent NSTEMI.  I had extensive conversations with specialists and the patient's family members.  She will be admitted to the hospital for medical management.  Family is interested in palliative care or hospice. Final Clinical Impression(s) / ED Diagnoses Final diagnoses:  NSTEMI (non-ST elevated myocardial infarction) ([place])  Dementia without behavioral disturbance, unspecified dementia type ([place])  Stage 3b chronic kidney disease  ([place])    Rx /  DC Orders ED Discharge Orders     None        Arnaldo Natal, MD 11/01/20 [place]    Arnaldo Natal, MD 11/01/20 [phone]

## 2020-10-31 NOTE — ED Triage Notes (Signed)
Per daughter  ,  pt started c/o gen weakness , noticed having heavy breathing and stating that her chest hurts    Has hx of dementia

## 2020-11-01 ENCOUNTER — Encounter (HOSPITAL_COMMUNITY): Payer: Self-pay | Admitting: Internal Medicine

## 2020-11-01 ENCOUNTER — Inpatient Hospital Stay (HOSPITAL_COMMUNITY): Payer: Medicare Other

## 2020-11-01 DIAGNOSIS — E782 Mixed hyperlipidemia: Secondary | ICD-10-CM | POA: Diagnosis present

## 2020-11-01 DIAGNOSIS — Z8249 Family history of ischemic heart disease and other diseases of the circulatory system: Secondary | ICD-10-CM | POA: Diagnosis not present

## 2020-11-01 DIAGNOSIS — Z7189 Other specified counseling: Secondary | ICD-10-CM

## 2020-11-01 DIAGNOSIS — I672 Cerebral atherosclerosis: Secondary | ICD-10-CM | POA: Diagnosis present

## 2020-11-01 DIAGNOSIS — Z809 Family history of malignant neoplasm, unspecified: Secondary | ICD-10-CM | POA: Diagnosis not present

## 2020-11-01 DIAGNOSIS — N1832 Chronic kidney disease, stage 3b: Secondary | ICD-10-CM | POA: Diagnosis not present

## 2020-11-01 DIAGNOSIS — Z9049 Acquired absence of other specified parts of digestive tract: Secondary | ICD-10-CM | POA: Diagnosis not present

## 2020-11-01 DIAGNOSIS — E876 Hypokalemia: Secondary | ICD-10-CM | POA: Diagnosis present

## 2020-11-01 DIAGNOSIS — I5032 Chronic diastolic (congestive) heart failure: Secondary | ICD-10-CM | POA: Diagnosis present

## 2020-11-01 DIAGNOSIS — I214 Non-ST elevation (NSTEMI) myocardial infarction: Secondary | ICD-10-CM

## 2020-11-01 DIAGNOSIS — K219 Gastro-esophageal reflux disease without esophagitis: Secondary | ICD-10-CM | POA: Diagnosis present

## 2020-11-01 DIAGNOSIS — I10 Essential (primary) hypertension: Secondary | ICD-10-CM

## 2020-11-01 DIAGNOSIS — Z515 Encounter for palliative care: Secondary | ICD-10-CM | POA: Diagnosis not present

## 2020-11-01 DIAGNOSIS — F0390 Unspecified dementia without behavioral disturbance: Secondary | ICD-10-CM

## 2020-11-01 DIAGNOSIS — I130 Hypertensive heart and chronic kidney disease with heart failure and stage 1 through stage 4 chronic kidney disease, or unspecified chronic kidney disease: Secondary | ICD-10-CM | POA: Diagnosis present

## 2020-11-01 DIAGNOSIS — Z7982 Long term (current) use of aspirin: Secondary | ICD-10-CM | POA: Diagnosis not present

## 2020-11-01 DIAGNOSIS — I350 Nonrheumatic aortic (valve) stenosis: Secondary | ICD-10-CM | POA: Diagnosis present

## 2020-11-01 DIAGNOSIS — D72829 Elevated white blood cell count, unspecified: Secondary | ICD-10-CM | POA: Diagnosis present

## 2020-11-01 DIAGNOSIS — U071 COVID-19: Secondary | ICD-10-CM | POA: Diagnosis present

## 2020-11-01 DIAGNOSIS — F32A Depression, unspecified: Secondary | ICD-10-CM | POA: Diagnosis present

## 2020-11-01 DIAGNOSIS — Z85828 Personal history of other malignant neoplasm of skin: Secondary | ICD-10-CM | POA: Diagnosis not present

## 2020-11-01 DIAGNOSIS — Z79899 Other long term (current) drug therapy: Secondary | ICD-10-CM | POA: Diagnosis not present

## 2020-11-01 DIAGNOSIS — Z66 Do not resuscitate: Secondary | ICD-10-CM | POA: Diagnosis present

## 2020-11-01 DIAGNOSIS — I5043 Acute on chronic combined systolic (congestive) and diastolic (congestive) heart failure: Secondary | ICD-10-CM | POA: Diagnosis present

## 2020-11-01 DIAGNOSIS — Z90710 Acquired absence of both cervix and uterus: Secondary | ICD-10-CM | POA: Diagnosis not present

## 2020-11-01 HISTORY — DX: Unspecified dementia, unspecified severity, without behavioral disturbance, psychotic disturbance, mood disturbance, and anxiety: F03.90

## 2020-11-01 HISTORY — DX: Chronic kidney disease, stage 3b: N18.32

## 2020-11-01 HISTORY — DX: Chronic diastolic (congestive) heart failure: I50.32

## 2020-11-01 LAB — CBC WITH DIFFERENTIAL/PLATELET
Abs Immature Granulocytes: 0.22 10*3/uL — ABNORMAL HIGH (ref 0.00–0.07)
Basophils Absolute: 0.1 10*3/uL (ref 0.0–0.1)
Basophils Relative: 0 %
Eosinophils Absolute: 0 10*3/uL (ref 0.0–0.5)
Eosinophils Relative: 0 %
HCT: 35.7 % — ABNORMAL LOW (ref 36.0–46.0)
Hemoglobin: 11.9 g/dL — ABNORMAL LOW (ref 12.0–15.0)
Immature Granulocytes: 1 %
Lymphocytes Relative: 13 %
Lymphs Abs: 2.6 10*3/uL (ref 0.7–4.0)
MCH: 29.8 pg (ref 26.0–34.0)
MCHC: 33.3 g/dL (ref 30.0–36.0)
MCV: 89.3 fL (ref 80.0–100.0)
Monocytes Absolute: 2.4 10*3/uL — ABNORMAL HIGH (ref 0.1–1.0)
Monocytes Relative: 12 %
Neutro Abs: 14.9 10*3/uL — ABNORMAL HIGH (ref 1.7–7.7)
Neutrophils Relative %: 74 %
Platelets: 220 10*3/uL (ref 150–400)
RBC: 4 MIL/uL (ref 3.87–5.11)
RDW: 14.9 % (ref 11.5–15.5)
WBC: 20.2 10*3/uL — ABNORMAL HIGH (ref 4.0–10.5)
nRBC: 0.1 % (ref 0.0–0.2)

## 2020-11-01 LAB — LIPID PANEL
Cholesterol: 235 mg/dL — ABNORMAL HIGH (ref 0–200)
HDL: 50 mg/dL (ref >40)
LDL Cholesterol: 161 mg/dL — ABNORMAL HIGH (ref 0–99)
Total CHOL/HDL Ratio: 4.7 RATIO
Triglycerides: 122 mg/dL (ref <150)
VLDL: 24 mg/dL (ref 0–40)

## 2020-11-01 LAB — COMPREHENSIVE METABOLIC PANEL
ALT: 31 U/L (ref 0–44)
AST: 131 U/L — ABNORMAL HIGH (ref 15–41)
Albumin: 3.3 g/dL — ABNORMAL LOW (ref 3.5–5.0)
Alkaline Phosphatase: 84 U/L (ref 38–126)
Anion gap: 12 (ref 5–15)
BUN: 15 mg/dL (ref 8–23)
CO2: 25 mmol/L (ref 22–32)
Calcium: 8.8 mg/dL — ABNORMAL LOW (ref 8.9–10.3)
Chloride: 100 mmol/L (ref 98–111)
Creatinine, Ser: 1.32 mg/dL — ABNORMAL HIGH (ref 0.44–1.00)
GFR, Estimated: 38 mL/min — ABNORMAL LOW (ref >60)
Glucose, Bld: 121 mg/dL — ABNORMAL HIGH (ref 70–99)
Potassium: 3.1 mmol/L — ABNORMAL LOW (ref 3.5–5.1)
Sodium: 137 mmol/L (ref 135–145)
Total Bilirubin: 1.4 mg/dL — ABNORMAL HIGH (ref 0.3–1.2)
Total Protein: 6.2 g/dL — ABNORMAL LOW (ref 6.5–8.1)

## 2020-11-01 LAB — RESP PANEL BY RT-PCR (FLU A&B, COVID) ARPGX2
Influenza A by PCR: NEGATIVE
Influenza B by PCR: NEGATIVE
SARS Coronavirus 2 by RT PCR: POSITIVE — AB

## 2020-11-01 LAB — ECHOCARDIOGRAM COMPLETE
AR max vel: 1.89 cm2
AV Area VTI: 1.93 cm2
AV Area mean vel: 1.84 cm2
AV Mean grad: 2 mmHg
AV Peak grad: 3.5 mmHg
Ao pk vel: 0.94 m/s
Area-P 1/2: 4.15 cm2
Height: 57 in
S' Lateral: 3.5 cm
Weight: 2035.29 oz

## 2020-11-01 LAB — TROPONIN I (HIGH SENSITIVITY): Troponin I (High Sensitivity): >24000 ng/L (ref <18)

## 2020-11-01 LAB — HEPARIN LEVEL (UNFRACTIONATED): Heparin Unfractionated: >1.1 IU/mL — ABNORMAL HIGH (ref 0.30–0.70)

## 2020-11-01 LAB — MAGNESIUM: Magnesium: 1.5 mg/dL — ABNORMAL LOW (ref 1.7–2.4)

## 2020-11-01 MED ORDER — CLOPIDOGREL BISULFATE 75 MG PO TABS
75.0000 mg | ORAL_TABLET | Freq: Every day | ORAL | Status: DC
  Administered 2020-11-01 – 2020-11-02 (×2): 75 mg via ORAL
  Filled 2020-11-01 (×2): qty 1

## 2020-11-01 MED ORDER — ASPIRIN 325 MG PO TABS
325.0000 mg | ORAL_TABLET | Freq: Every day | ORAL | Status: DC
  Administered 2020-11-01: 325 mg via ORAL
  Filled 2020-11-01 (×2): qty 1

## 2020-11-01 MED ORDER — ACETAMINOPHEN 325 MG PO TABS: 650.0000 mg | ORAL_TABLET | ORAL | Status: DC | PRN

## 2020-11-01 MED ORDER — LORATADINE 10 MG PO TABS
10.0000 mg | ORAL_TABLET | Freq: Every day | ORAL | Status: DC
  Administered 2020-11-01 – 2020-11-02 (×2): 10 mg via ORAL
  Filled 2020-11-01 (×2): qty 1

## 2020-11-01 MED ORDER — ATORVASTATIN CALCIUM 40 MG PO TABS
40.0000 mg | ORAL_TABLET | Freq: Every day | ORAL | Status: DC
  Administered 2020-11-01 – 2020-11-02 (×2): 40 mg via ORAL
  Filled 2020-11-01 (×3): qty 1

## 2020-11-01 MED ORDER — METOPROLOL TARTRATE 25 MG PO TABS
25.0000 mg | ORAL_TABLET | Freq: Two times a day (BID) | ORAL | Status: DC
  Administered 2020-11-01 – 2020-11-02 (×3): 25 mg via ORAL
  Filled 2020-11-01 (×3): qty 1

## 2020-11-01 MED ORDER — NITROGLYCERIN 0.4 MG SL SUBL: 0.4000 mg | SUBLINGUAL_TABLET | SUBLINGUAL | Status: DC | PRN

## 2020-11-01 MED ORDER — HEPARIN (PORCINE) 25000 UT/250ML-% IV SOLN
1700.0000 [IU]/h | INTRAVENOUS | Status: DC
  Administered 2020-11-01: 1700 [IU]/h via INTRAVENOUS
  Filled 2020-11-01: qty 250

## 2020-11-01 MED ORDER — FUROSEMIDE 10 MG/ML IJ SOLN
60.0000 mg | Freq: Once | INTRAMUSCULAR | Status: AC
  Administered 2020-11-01: 60 mg via INTRAVENOUS
  Filled 2020-11-01: qty 6

## 2020-11-01 MED ORDER — AMLODIPINE BESYLATE 2.5 MG PO TABS
2.5000 mg | ORAL_TABLET | Freq: Every day | ORAL | Status: DC
  Administered 2020-11-01 – 2020-11-02 (×2): 2.5 mg via ORAL
  Filled 2020-11-01 (×2): qty 1

## 2020-11-01 MED ORDER — FUROSEMIDE 10 MG/ML IJ SOLN
20.0000 mg | Freq: Once | INTRAMUSCULAR | Status: AC
  Administered 2020-11-01: 20 mg via INTRAVENOUS
  Filled 2020-11-01: qty 2

## 2020-11-01 MED ORDER — ONDANSETRON HCL 4 MG/2ML IJ SOLN: 4.0000 mg | Freq: Four times a day (QID) | INTRAMUSCULAR | Status: DC | PRN

## 2020-11-01 MED ORDER — POTASSIUM CHLORIDE CRYS ER 20 MEQ PO TBCR
20.0000 meq | EXTENDED_RELEASE_TABLET | Freq: Two times a day (BID) | ORAL | Status: DC
  Administered 2020-11-01 (×2): 20 meq via ORAL
  Filled 2020-11-01 (×2): qty 1

## 2020-11-01 MED ORDER — ASPIRIN EC 81 MG PO TBEC
81.0000 mg | DELAYED_RELEASE_TABLET | Freq: Every day | ORAL | Status: DC
  Administered 2020-11-02: 81 mg via ORAL
  Filled 2020-11-01: qty 1

## 2020-11-01 MED ORDER — ALBUTEROL SULFATE (2.5 MG/3ML) 0.083% IN NEBU: 2.5000 mg | INHALATION_SOLUTION | RESPIRATORY_TRACT | Status: DC | PRN

## 2020-11-01 MED ORDER — POTASSIUM CHLORIDE 10 MEQ/100ML IV SOLN
10.0000 meq | Freq: Once | INTRAVENOUS | Status: AC
  Administered 2020-11-01: 10 meq via INTRAVENOUS
  Filled 2020-11-01: qty 100

## 2020-11-01 MED ORDER — POLYETHYLENE GLYCOL 3350 17 G PO PACK: 17.0000 g | PACK | Freq: Every day | ORAL | Status: DC | PRN

## 2020-11-01 MED ORDER — HEPARIN BOLUS VIA INFUSION
3000.0000 [IU] | Freq: Once | INTRAVENOUS | Status: AC
  Administered 2020-11-01: 3000 [IU] via INTRAVENOUS
  Filled 2020-11-01: qty 3000

## 2020-11-01 MED ORDER — HEPARIN (PORCINE) 25000 UT/250ML-% IV SOLN
800.0000 [IU]/h | INTRAVENOUS | Status: DC
  Administered 2020-11-01 – 2020-11-02 (×2): 700 [IU]/h via INTRAVENOUS
  Filled 2020-11-01: qty 250

## 2020-11-01 MED ORDER — FUROSEMIDE 10 MG/ML IJ SOLN: 40.0000 mg | Freq: Every day | INTRAMUSCULAR | Status: DC

## 2020-11-01 NOTE — Progress Notes (Signed)
ANTICOAGULATION CONSULT NOTE - Initial Consult  Pharmacy Consult for Heparin Indication: chest pain/ACS  No Known Allergies  Patient Measurements: Height: 4\' 9"  (144.8 cm) Weight: 57.7 kg (127 lb 3.3 oz) IBW/kg (Calculated) : 38.6 Heparin Dosing Weight: 55 kg  Vital Signs: Temp: 98.1 F (36.7 C) (07/01 1609) Temp Source: Oral (07/01 1609) BP: 112/68 (07/01 1609) Pulse Rate: 78 (07/01 1609)  Labs: Recent Labs    10/17/2020 1944 10/12/2020 2130 11/01/20 0634 11/01/20 1638  HGB 12.4  --  11.9*  --   HCT 36.6  --  35.7*  --   PLT 263  --  220  --   HEPARINUNFRC  --   --   --  >1.10*  CREATININE 1.30*  --  1.32*  --   TROPONINIHS 22,983* 22,496* >24,000*  --      Estimated Creatinine Clearance: 19.4 mL/min (A) (by C-G formula based on SCr of 1.32 mg/dL (H)).   Medical History: Past Medical History:  Diagnosis Date   Aortic stenosis, mild    Aortic valve disorder    Basal cell carcinoma of scalp 03/14/2019   Carotid arterial disease ([facility])    Carotid artery stenosis    bilateral   Cerebral atherosclerosis    Chronic diastolic CHF (congestive heart failure) ([place]) 11/01/2020   Chronic kidney disease, stage 3b ([place]) 11/01/2020   CKD (chronic kidney disease), stage III ([facility])    Dementia ([place]) 11/01/2020   Depression    Essential hypertension 01/15/2016   GERD (gastroesophageal reflux disease)    Hyperlipidemia    Hypertension    Renal artery stenosis ([facility])    Vertigo    Vitamin B deficiency       Assessment: 87 y.o. female with NSTEMI. No anticoagulation prior to admission. Pharmacy consulted for heparin.    First HL > 1.1 on 1700 units/hr. Heparin is running in L AC and level was drawn from R hand.  No issues with infusion or bleeding per RN.   Goal of Therapy:  Heparin level 0.3-0.7 units/ml Monitor platelets by anticoagulation protocol: Yes   Plan:  Hold heparin for 2 hours Restart heparin at 700 units/hr Monitor daily HL, CBC/plt Monitor for signs/symptoms  of bleeding    Benetta Spar, PharmD, BCPS, BCCP Clinical Pharmacist  Please check AMION for all [facility] phone numbers After 10:00 PM, call [place]

## 2020-11-01 NOTE — ED Notes (Signed)
RT reassessed pt post lasix. Pt still sounds wet w/BS bilat rhonchi/fine crackles, w/increased WOB at this time. Pt sats on [place] 2Lpm are 93% and tachypneic in the mid to upper 30's. RT will continue to monitor.

## 2020-11-01 NOTE — ED Notes (Signed)
Pt fluids stopped. Increased labor for respirations. Crackles auscultated.

## 2020-11-01 NOTE — Progress Notes (Signed)
ANTICOAGULATION CONSULT NOTE - Initial Consult  Pharmacy Consult for Heparin Indication: chest pain/ACS  No Known Allergies  Patient Measurements: Height: 4\' 9"  (144.8 cm) Weight: 57.7 kg (127 lb 3.3 oz) IBW/kg (Calculated) : 38.6 Heparin Dosing Weight: 55 kg  Vital Signs: Temp: 98.7 F (37.1 C) (07/01 0305) Temp Source: Oral (07/01 0030) BP: 150/101 (07/01 0305) Pulse Rate: 91 (07/01 0305)  Labs: Recent Labs    10/09/2020 1944 10/06/2020 2130  HGB 12.4  --   HCT 36.6  --   PLT 263  --   CREATININE 1.30*  --   TROPONINIHS 22,983* 22,496*    Estimated Creatinine Clearance: 19.7 mL/min (A) (by C-G formula based on SCr of 1.3 mg/dL (H)).   Medical History: Past Medical History:  Diagnosis Date   Aortic stenosis, mild    Aortic valve disorder    Basal cell carcinoma of scalp 03/14/2019   Carotid arterial disease ([facility])    Carotid artery stenosis    bilateral   Cerebral atherosclerosis    Chronic diastolic CHF (congestive heart failure) ([place]) 11/01/2020   Chronic kidney disease, stage 3b ([place]) 11/01/2020   CKD (chronic kidney disease), stage III ([facility])    Dementia ([place]) 11/01/2020   Depression    Essential hypertension 01/15/2016   GERD (gastroesophageal reflux disease)    Hyperlipidemia    Hypertension    Renal artery stenosis ([facility])    Vertigo    Vitamin B deficiency     Medications:  No current facility-administered medications on file prior to encounter.   Current Outpatient Medications on File Prior to Encounter  Medication Sig Dispense Refill   amLODipine (NORVASC) 2.5 MG tablet amlodipine 2.5 mg tablet  TK 1 T PO QD     cyanocobalamin (,VITAMIN B-12,) 1000 MCG/ML injection INJECT 1ML IM Q MONTH  4   imiquimod (ALDARA) 5 % cream imiquimod 5 % topical cream packet     loratadine (CLARITIN) 10 MG tablet Claritin 10 mg tablet  Take 1 tablet every day by oral route.     propranolol (INDERAL) 10 MG tablet propranolol 10 mg tablet  TK 1 T PO BID OES     VITAMIN D,  CHOLECALCIFEROL, PO Take by mouth.     [DISCONTINUED] aspirin 81 MG tablet Take 81 mg by mouth daily.       Assessment: 87 y.o. female with chest pain and elevated troponin for heparin  Goal of Therapy:  Heparin level 0.3-0.7 units/ml Monitor platelets by anticoagulation protocol: Yes   Plan:  Heparin 3000 units IV bolus, then start heparin 700 units/hr Check heparin level in 8 hours.    Caryl Pina 11/01/2020,6:52 AM

## 2020-11-01 NOTE — Progress Notes (Signed)
PROGRESS NOTE    Kaitlyn Clark  NOI:[id number] DOB: February 08, 1925 DOA: 10/19/2020 PCP: Jonathon Jordan, MD    Brief Narrative:  87 year old female with past medical history of advanced dementia, recent COVID infection (Dx 6/7), hypertension, hyperlipidemia, depression, chronic kidney disease stage IIIb, vitamin B12 deficiency, diastolic congestive heart failure (Echo 01/2016 EF 55-60% with G1DD), and basal cell carcinoma who presents to [place] floor as an incoming transfer from med center [facility] emergency department for NSTEMI.  Assessment & Plan:   Principal Problem:   NSTEMI (non-ST elevated myocardial infarction) ([place]) Active Problems:   Essential hypertension   Dementia without behavioral disturbance (HCC)   Chronic kidney disease, stage 3b (HCC)   Mixed hyperlipidemia   Chronic diastolic CHF (congestive heart failure) (HCC)   Hypokalemia   Leukocytosis  Principal Problem:   NSTEMI (non-ST elevated myocardial infarction) [facility])  Patient presenting with 24-hour history of worsening shortness of breath and intermittent chest discomfort. EKG revealing T wave inversions with ST segment depression throughout the precordial leads. Patient presented with troponin of 22,983 followed by 22,496 consistent with NSTEMI, trop peaked to >24,000 Patient received a dose of subcutaneous Lovenox at med center [facility] .  We will transition patient to heparin infusion for now until family and cardiology can decide whether they will pursue cardiac catheterization. Appreciate input by Cardiology. Recommendation for continued heparin gtt x 48hrs, and consider asa and plavix Cont on beta blocker and statin Appreciate input by Palliative Care, following If pt decompensates, then may focus on palliative only measures   Active Problems: Acute on chronic diastolic CHF (congestive heart failure) ([place])  Patient presenting with markedly elevated BNP of 1391 with evidence of  acute cardiogenic pulmonary edema on chest imaging Received lasix per Cardiology     Essential hypertension  Continue home regimen of amlodipine     Dementia without behavioral disturbance ([place])  Frequent redirection by nursing staff Encouraging family to remain at bedside is much as possible Stable at present     Chronic kidney disease, stage 3b ([place])  Strict intake and output monitoring Minimizing nephrotoxic agents as much as possible Recheck bmet in AM     Mixed hyperlipidemia  Obtaining lipid panel this morning Continue Atorvastatin 40 mg daily.      Hypokalemia  Replaced Monitoring potassium levels with serial chemistries     Leukocytosis  Leukocytosis of 19.6 Chest imaging reveals no evidence of pneumonia Urinalysis still pending   Goals of care discussion  Discussed goals of care with daughter at the bedside who happened to have medical POA Daughter is interested in palliative care consultation, referral placed Daughter is additionally leaning against any significant invasive intervention but is willing to discuss the possibility of cardiac catheterization with cardiology later this morning.   Recent COVID-19 infection.  Recently diagnosed with COVID-19 infection June 7 Patient received Bebtelovimab June 8 Covid test is pos, however, pt is within 21 day and 90 day window where pt would not be considered contagious. No need for isolation   DVT prophylaxis: heparin gtt Code Status: DNR Family Communication: Pt in room, family at bedside  Status is: Inpatient  Remains inpatient appropriate because:Inpatient level of care appropriate due to severity of illness  Dispo: The patient is from: Home              Anticipated d/c is to:  Unclear at this time              Patient currently is not  medically stable to d/c.   Difficult to place patient No       Consultants:  Cardiology Palliative Care  Procedures:    Antimicrobials: Anti-infectives (From  admission, onward)    None       Subjective: Without complaints. Denies chest pain this AM  Objective: Vitals:   11/01/20 0305 11/01/20 0748 11/01/20 1153 11/01/20 1609  BP: (!) 150/101 (!) 139/98 119/80 112/68  Pulse: 91 92 75 78  Resp: 16 19 18  (!) 22  Temp: 98.7 F (37.1 C) 98.1 F (36.7 C) 97.7 F (36.5 C) 98.1 F (36.7 C)  TempSrc: Oral Oral Oral Oral  SpO2: 99% 94% 96% 98%  Weight: 57.7 kg     Height: 4\' 9"  (1.448 m)       Intake/Output Summary (Last 24 hours) at 11/01/2020 1711 Last data filed at 11/01/2020 0830 Gross per 24 hour  Intake 350 ml  Output 800 ml  Net -450 ml   Filed Weights   10/26/2020 1936 11/01/20 0305  Weight: 54.4 kg 57.7 kg    Examination: General exam: Awake, laying in bed, in nad Respiratory system: Normal respiratory effort, no wheezing Cardiovascular system: regular rate, s1, s2 Gastrointestinal system: Soft, nondistended, positive BS Central nervous system: CN2-12 grossly intact, strength intact Extremities: Perfused, no clubbing Skin: Normal skin turgor, no notable skin lesions seen Psychiatry: Mood normal // no visual hallucinations   Data Reviewed: I have personally reviewed following labs and imaging studies  CBC: Recent Labs  Lab 10/11/2020 1944 11/01/20 0634  WBC 19.6* 20.2*  NEUTROABS  --  14.9*  HGB 12.4 11.9*  HCT 36.6 35.7*  MCV 89.1 89.3  PLT 263 672   Basic Metabolic Panel: Recent Labs  Lab 10/22/2020 1944 11/01/20 0634  NA 138 137  K 3.3* 3.1*  CL 99 100  CO2 25 25  GLUCOSE 130* 121*  BUN 16 15  CREATININE 1.30* 1.32*  CALCIUM 9.8 8.8*  MG  --  1.5*   GFR: Estimated Creatinine Clearance: 19.4 mL/min (A) (by C-G formula based on SCr of 1.32 mg/dL (H)). Liver Function Tests: Recent Labs  Lab 10/02/2020 1944 11/01/20 0634  AST 103* 131*  ALT 22 31  ALKPHOS 91 84  BILITOT 1.0 1.4*  PROT 7.1 6.2*  ALBUMIN 4.2 3.3*   No results for input(s): LIPASE, AMYLASE in the last 168 hours. No results for  input(s): AMMONIA in the last 168 hours. Coagulation Profile: No results for input(s): INR, PROTIME in the last 168 hours. Cardiac Enzymes: No results for input(s): CKTOTAL, CKMB, CKMBINDEX, TROPONINI in the last 168 hours. BNP (last 3 results) No results for input(s): PROBNP in the last 8760 hours. HbA1C: No results for input(s): HGBA1C in the last 72 hours. CBG: No results for input(s): GLUCAP in the last 168 hours. Lipid Profile: Recent Labs    11/01/20 0634  CHOL 235*  HDL 50  LDLCALC 161*  TRIG 122  CHOLHDL 4.7   Thyroid Function Tests: No results for input(s): TSH, T4TOTAL, FREET4, T3FREE, THYROIDAB in the last 72 hours. Anemia Panel: No results for input(s): VITAMINB12, FOLATE, FERRITIN, TIBC, IRON, RETICCTPCT in the last 72 hours. Sepsis Labs: No results for input(s): PROCALCITON, LATICACIDVEN in the last 168 hours.  Recent Results (from the past 240 hour(s))  Resp Panel by RT-PCR (Flu A&B, Covid) Nasopharyngeal Swab     Status: Abnormal   Collection Time: 11/01/20 12:00 AM   Specimen: Nasopharyngeal Swab; Nasopharyngeal(NP) swabs in vial transport medium  Result Value Ref  Range Status   SARS Coronavirus 2 by RT PCR POSITIVE (A) NEGATIVE Final    Comment: RESULT CALLED TO, READ BACK BY AND VERIFIED WITH: Charm Rings, RN 11/01/20 @ 0114 BY AV (NOTE) SARS-CoV-2 target nucleic acids are DETECTED.  The SARS-CoV-2 RNA is generally detectable in upper respiratory specimens during the acute phase of infection. Positive results are indicative of the presence of the identified virus, but do not rule out bacterial infection or co-infection with other pathogens not detected by the test. Clinical correlation with patient history and other diagnostic information is necessary to determine patient infection status. The expected result is Negative.  Fact Sheet for Patients: EntrepreneurPulse.com.au  Fact Sheet for Healthcare  Providers: IncredibleEmployment.be  This test is not yet approved or cleared by the [place] FDA and  has been authorized for detection and/or diagnosis of SARS-CoV-2 by FDA under an Emergency Use Authorization (EUA).  This EUA will remain in effect (meaning this test can be  used) for the duration of  the COVID-19 declaration under Section 564(b)(1) of the Act, 21 U.S.C. section 360bbb-3(b)(1), unless the authorization is terminated or revoked sooner.     Influenza A by PCR NEGATIVE NEGATIVE Final   Influenza B by PCR NEGATIVE NEGATIVE Final    Comment: (NOTE) The Xpert Xpress SARS-CoV-2/FLU/RSV plus assay is intended as an aid in the diagnosis of influenza from Nasopharyngeal swab specimens and should not be used as a sole basis for treatment. Nasal washings and aspirates are unacceptable for Xpert Xpress SARS-CoV-2/FLU/RSV testing.  Fact Sheet for Patients: EntrepreneurPulse.com.au  Fact Sheet for Healthcare Providers: IncredibleEmployment.be  This test is not yet approved or cleared by the [place] FDA and has been authorized for detection and/or diagnosis of SARS-CoV-2 by FDA under an Emergency Use Authorization (EUA). This EUA will remain in effect (meaning this test can be used) for the duration of the COVID-19 declaration under Section 564(b)(1) of the Act, 21 U.S.C. section 360bbb-3(b)(1), unless the authorization is terminated or revoked.  Performed at [facility], [address], [place], [place] [address]      Radiology Studies: CT Angio Chest PE W and/or Wo Contrast  Result Date: 10/08/2020 CLINICAL DATA:  Pleurisy, elevated D-dimer EXAM: CT ANGIOGRAPHY CHEST WITH CONTRAST TECHNIQUE: Multidetector CT imaging of the chest was performed using the standard protocol during bolus administration of intravenous contrast. Multiplanar CT image reconstructions and MIPs were obtained to  evaluate the vascular anatomy. CONTRAST:  80mL OMNIPAQUE IOHEXOL 350 MG/ML SOLN COMPARISON:  07/27/2011 FINDINGS: Cardiovascular: There is adequate opacification of the pulmonary arterial tree. There is no intraluminal filling defect identified to suggest acute pulmonary embolism. The central pulmonary arteries are of normal caliber. Extensive multi-vessel coronary artery calcification. Cardiac size is mildly enlarged with mild left ventricular hypertrophy noted. Moderate atherosclerotic calcification is seen within the thoracic aorta. No aortic aneurysm. Mediastinum/Nodes: The thyroid gland is unremarkable. There is progressive shotty mediastinal adenopathy without frank pathologic enlargement, best appreciated within the high right paratracheal, prevascular, and aortopulmonary lymph node groups. The esophagus is unremarkable. Lungs/Pleura: Small bilateral pleural effusions are present. There is smooth interlobular septal thickening best appreciated at the lung bases and ground-glass pulmonary infiltrate seen within the lung apices bilaterally most in keeping with mild pulmonary edema, likely cardiogenic in nature. No pneumothorax. Central airways are widely patent. Upper Abdomen: No acute abnormality. Musculoskeletal: No acute bone abnormality. No lytic or blastic bone lesion. Degenerative changes are seen within the thoracic spine. Review of the MIP images confirms  the above findings. IMPRESSION: No pulmonary embolism. Extensive multi-vessel coronary artery calcification. Mild left ventricular hypertrophy. Small bilateral pleural effusions and mild pulmonary edema most in keeping with mild cardiogenic failure. Aortic Atherosclerosis (ICD10-I70.0). Electronically Signed   By: Fidela Salisbury MD   On: 10/19/2020 23:18   DG Chest Port 1 View  Result Date: 10/14/2020 CLINICAL DATA:  Chest pain, generalized weakness, heavy breathing EXAM: PORTABLE CHEST 1 VIEW COMPARISON:  Radiograph 07/30/2004, CT 07/27/2011  FINDINGS: There is some chronically coarsened interstitial changes and bronchitic features throughout the lungs. No pneumothorax. Suspect small bilateral effusions. Faint patchy opacity is present in the retrocardiac and left lung periphery. The aorta is calcified. The remaining cardiomediastinal contours are unremarkable for portable technique. The osseous structures appear diffusely demineralized which may limit detection of small or nondisplaced fractures. No acute osseous abnormality or suspicious osseous lesion. No acute abnormality of the chest wall. Surgical clips in the right upper quadrant. Telemetry leads overlie the chest. IMPRESSION: Question some faint patchy opacity in the periphery of the left lung base and retrocardiac space. Could reflect atelectasis or airspace disease. Trace bilateral effusions. Background of diffuse chronically coarsened interstitial and bronchitic features. Aortic Atherosclerosis (ICD10-I70.0). Electronically Signed   By: Lovena Le M.D.   On: 10/26/2020 20:30   ECHOCARDIOGRAM COMPLETE  Result Date: 11/01/2020    ECHOCARDIOGRAM REPORT   Patient Name:   STEPHANIEMARIE STOFFEL Date of Exam: 11/01/2020 Medical Rec #:  [id number]         Height:       57.0 in Accession #:    [id number]        Weight:       127.2 lb Date of Birth:  1924-06-03          BSA:          1.484 m Patient Age:    75 years          BP:           150/101 mmHg Patient Gender: F                 HR:           91 bpm. Exam Location:  Inpatient Procedure: 2D Echo, Cardiac Doppler and Color Doppler Indications:    NSTEMI  History:        Patient has no prior history of Echocardiogram examinations.                 Aortic Valve Disease; Risk Factors:Hypertension.  Sonographer:    Cammy Brochure Referring Phys: [id number] [place]  1. Left ventricular ejection fraction, by estimation, is 50 to 55%. The left ventricle has low normal function. The left ventricle has no regional wall motion abnormalities.  Left ventricular diastolic parameters are consistent with Grade III diastolic dysfunction (restrictive).  2. Right ventricular systolic function is moderately reduced. The right ventricular size is normal.  3. The mitral valve is grossly normal. Mild mitral valve regurgitation.  4. The aortic valve is calcified. There is moderate calcification of the aortic valve. There is moderate thickening of the aortic valve. Aortic valve regurgitation is not visualized. No aortic stenosis is present. FINDINGS  Left Ventricle: Left ventricular ejection fraction, by estimation, is 50 to 55%. The left ventricle has low normal function. The left ventricle has no regional wall motion abnormalities. The left ventricular internal cavity size was normal in size. There is no left ventricular hypertrophy. Left ventricular diastolic parameters are consistent  with Grade III diastolic dysfunction (restrictive). Right Ventricle: The right ventricular size is normal. Right vetricular wall thickness was not well visualized. Right ventricular systolic function is moderately reduced. Left Atrium: Left atrial size was normal in size. Right Atrium: Right atrial size was normal in size. Pericardium: There is no evidence of pericardial effusion. Mitral Valve: The mitral valve is grossly normal. Mild mitral valve regurgitation. Tricuspid Valve: The tricuspid valve is grossly normal. Tricuspid valve regurgitation is not demonstrated. Aortic Valve: The aortic valve is calcified. There is moderate calcification of the aortic valve. There is moderate thickening of the aortic valve. There is moderate aortic valve annular calcification. Aortic valve regurgitation is not visualized. No aortic stenosis is present. Aortic valve mean gradient measures 2.0 mmHg. Aortic valve peak gradient measures 3.5 mmHg. Aortic valve area, by VTI measures 1.93 cm. Pulmonic Valve: The pulmonic valve was grossly normal. Pulmonic valve regurgitation is trivial. Aorta: The  aortic root and ascending aorta are structurally normal, with no evidence of dilitation. IAS/Shunts: The atrial septum is grossly normal.  LEFT VENTRICLE PLAX 2D LVIDd:         4.40 cm  Diastology LVIDs:         3.50 cm  LV e' medial:    4.57 cm/s LV PW:         0.90 cm  LV E/e' medial:  13.3 LV IVS:        0.90 cm  LV e' lateral:   6.31 cm/s LVOT diam:     1.80 cm  LV E/e' lateral: 9.6 LV SV:         35 LV SV Index:   24 LVOT Area:     2.54 cm  RIGHT VENTRICLE            IVC RV Basal diam:  3.20 cm    IVC diam: 1.60 cm RV S prime:     6.85 cm/s TAPSE (M-mode): 1.1 cm LEFT ATRIUM             Index       RIGHT ATRIUM           Index LA diam:        3.10 cm 2.09 cm/m  RA Area:     11.60 cm LA Vol (A2C):   44.9 ml 30.26 ml/m RA Volume:   24.60 ml  16.58 ml/m LA Vol (A4C):   44.6 ml 30.05 ml/m LA Biplane Vol: 47.8 ml 32.21 ml/m  AORTIC VALVE AV Area (Vmax):    1.89 cm AV Area (Vmean):   1.84 cm AV Area (VTI):     1.93 cm AV Vmax:           93.80 cm/s AV Vmean:          64.500 cm/s AV VTI:            0.183 m AV Peak Grad:      3.5 mmHg AV Mean Grad:      2.0 mmHg LVOT Vmax:         69.70 cm/s LVOT Vmean:        46.600 cm/s LVOT VTI:          0.139 m LVOT/AV VTI ratio: 0.76  AORTA Ao Root diam: 2.70 cm Ao Asc diam:  3.50 cm MITRAL VALVE MV Area (PHT): 4.15 cm    SHUNTS MV Decel Time: 183 msec    Systemic VTI:  0.14 m MV E velocity: 60.80 cm/s  Systemic Diam: 1.80 cm  MV A velocity: 50.30 cm/s MV E/A ratio:  1.21 Mertie Moores MD Electronically signed by Mertie Moores MD Signature Date/Time: 11/01/2020/3:19:50 PM    Final     Scheduled Meds:  amLODipine  2.5 mg Oral Daily   [START ON 11/02/2020] aspirin EC  81 mg Oral Daily   atorvastatin  40 mg Oral Daily   clopidogrel  75 mg Oral Daily   loratadine  10 mg Oral Daily   metoprolol tartrate  25 mg Oral BID   potassium chloride  20 mEq Oral BID   Continuous Infusions:  heparin 1,700 Units/hr (11/01/20 0823)     LOS: 0 days   Marylu Lund, MD [facility] Pager On Amion  If 7PM-7AM, please contact night-coverage 11/01/2020, 5:11 PM

## 2020-11-01 NOTE — Consult Note (Signed)
Consultation Note Date: 11/01/2020   Patient Name: Kaitlyn Clark  DOB: 07-18-1924  MRN: [id number]  Age / Sex: 87 y.o., female  PCP: Jonathon Jordan, MD Referring Physician: Donne Hazel, MD  Reason for Consultation: Establishing goals of care "massive MI, family interested in comfort care"  HPI/Patient Profile: 87 y.o. female  with past medical history of advanced dementia, recent COVID infection (05/09/15), diastolic congestive heart failure (echo 01/2016 with EF 55-60% and grade 1 diastolic dysfunction), chronic kidney disease stage IIIb, hypertension, hyperlipidemia, vitamin B12 deficiency, and basal cell carcinoma. She presented to [facility] emergency department on 10/04/2020 with chest pain and persistent fatigue throughout the day. In the ED, patient was found to have significantly elevated troponin as high as 22,983 consistent with NSTEMI. Chest x-ray revealed evidence of pulmonary edema. Patient was transferred to [facility].   Clinical Assessment and Goals of Care: I have reviewed medical records including EPIC notes, labs and imaging, and examined the patient at bedside. She is pleasantly confused with no acute complaints.   I met in the [facility] waiting room with daughters Vaughan Basta and Tye Maryland  to discuss diagnosis, prognosis, GOC, EOL wishes, disposition, and options. I introduced Palliative Medicine as specialized medical care for people living with serious illness. It focuses on providing relief from the symptoms and stress of a serious illness.   We discussed a brief life review of the patient. She is originally from [place], [place] and has spent most of her life there. Her husband passed away in 2006/07/04. There are 3 daughters - Vaughan Basta ([facility]), Cathy ([place]), and Maudie Mercury [facility]). She started showing signs of dementia around age 6. Then around age 87, family realized she could no longer live alone (safely), so they  started "sharing time" with her. She would rotate staying with each daughter for 3-4 months at a time. She has lived with Maudie Mercury since September of 2021.   As far as functional status prior to admission, she was ambulatory with assistance. She required assistance with all ADLs with the exception that she was able to feed herself. Daughters report she seemed to be eating well, up until the past few days.    We discussed her current illness and what it means in the larger context of her ongoing co-morbidities. Discussed that she had a major myocardial infarction. We also reviewed that dementia is a progressive, non-curable disease underlying the patient's current acute medical conditions. We reviewed specific indicators that patient had reached end stages, including inability to communicate, bed bound/non-ambulatory status, decreased oral intake, and her incontinence of bowel/bladder.   The difference between full scope medical intervention and comfort care was considered.  I introduced the concept of a comfort path to family, emphasizing that this path involves de-escalating and stopping full scope medical interventions, allowing a natural course to occur. Discussed that the goal is comfort and dignity rather than cure/prolonging life.  Introduced hospice philosophy and provided information hospice services at home (or SNF) vs residential hospice services - answered all questions.   Discussed disposition  options with Roxana Hires; SNF for rehab versus hospice. Emphasized that the focus of SNF/rehab is improvement or at least stabilization of functional status, where the focus of hospice is comfort and quality of life within the setting of end-stage illness. Vaughan Basta and [place] express concern that they are not able to care for her at home in her current condition. I provided education that she may be eligible for residential hospice due to her minimal oral intake.   For now, daughters need time to process and  consider the various disposition options. They wish to continue current medical care for now, and will await results of further diagnostics (echo) before making further decisions. They also would like for her to be evaluated by PT/OT.   Questions and concerns were addressed.  The family was encouraged to call with questions or concerns.    Primary decision maker: daughters Vaughan Basta and Tye Maryland    SUMMARY OF RECOMMENDATIONS   Continue current medical care Daughters are awaiting results of echo and recommendations from attending prior to making further decisions Daughters requesting PT and OT consults to see if rehab is an option Patient may be eligible for residential hospice if consistent with GOC and if oral intake remains poor PMT will follow-up with family tomorrow  Code Status/Advance Care Planning: DNR  Palliative Prophylaxis:  Oral Care and Turn Reposition  Additional Recommendations (Limitations, Scope, Preferences): No Artificial Feeding  Psycho-social/Spiritual:  Created space and opportunity for family to express thoughts and feelings regarding patient's current medical situation.  Emotional support provided  Prognosis:  Poor overall  Discharge Planning: To Be Determined      Primary Diagnoses: Present on Admission:  NSTEMI (non-ST elevated myocardial infarction) ([place])  Dementia without behavioral disturbance ([place])  Essential hypertension  Chronic kidney disease, stage 3b (HCC)  Mixed hyperlipidemia  Chronic diastolic CHF (congestive heart failure) (HCC)  Hypokalemia  Leukocytosis   I have reviewed the medical record, interviewed the patient and family, and examined the patient. The following aspects are pertinent.  Past Medical History:  Diagnosis Date   Aortic stenosis, mild    Aortic valve disorder    Basal cell carcinoma of scalp 03/14/2019   Carotid arterial disease (HCC)    Carotid artery stenosis    bilateral   Cerebral atherosclerosis    Chronic  diastolic CHF (congestive heart failure) ([place]) 11/01/2020   Chronic kidney disease, stage 3b ([place]) 11/01/2020   CKD (chronic kidney disease), stage III (HCC)    Dementia ([place]) 11/01/2020   Depression    Essential hypertension 01/15/2016   GERD (gastroesophageal reflux disease)    Hyperlipidemia    Hypertension    Renal artery stenosis (HCC)    Vertigo    Vitamin B deficiency     Family History  Problem Relation Age of Onset   Heart attack Father    Cancer Father    CAD Father    Colon cancer Neg Hx    Colon polyps Neg Hx    Liver disease Neg Hx    Scheduled Meds:  amLODipine  2.5 mg Oral Daily   aspirin  325 mg Oral Daily   atorvastatin  40 mg Oral Daily   loratadine  10 mg Oral Daily   metoprolol tartrate  25 mg Oral BID   Continuous Infusions:  heparin 1,700 Units/hr (11/01/20 0823)   PRN Meds:.acetaminophen, albuterol, nitroGLYCERIN, ondansetron (ZOFRAN) IV, polyethylene glycol Medications Prior to Admission:  Prior to Admission medications   Medication Sig Start Date End Date  Taking? Authorizing Provider  amLODipine (NORVASC) 2.5 MG tablet Take 2.5 mg by mouth daily. 01/16/16  Yes [provider]  aspirin EC 81 MG tablet Take 81 mg by mouth daily. Swallow whole.   Yes [provider]  loratadine (CLARITIN) 10 MG tablet Take 10 mg by mouth daily.   Yes [provider]  propranolol (INDERAL) 10 MG tablet Take 10 mg by mouth 2 (two) times daily.   Yes [provider]  QUEtiapine (SEROQUEL) 25 MG tablet Take 25 mg by mouth at bedtime. 08/15/20  Yes [provider]  VITAMIN D, CHOLECALCIFEROL, PO Take 1 tablet by mouth daily.   Yes [provider]   No Known Allergies Review of Systems  Unable to perform ROS: Dementia   Physical Exam Vitals reviewed.  Constitutional:      General: She is not in acute distress.    Appearance: She is ill-appearing.  Cardiovascular:     Rate and Rhythm: Normal rate and regular rhythm.   Pulmonary:     Effort: Pulmonary effort is normal.  Neurological:     Mental Status: She is alert. She is confused.     Motor: Weakness present.  Psychiatric:        Cognition and Memory: Cognition is impaired.    Vital Signs: BP 119/80 (BP Location: Left Wrist)   Pulse 75   Temp 97.7 F (36.5 C) (Oral)   Resp 18   Ht 4' 9"  (1.448 m)   Wt 57.7 kg   SpO2 96%   BMI 27.53 kg/m  Pain Scale: 0-10   Pain Score: 0-No pain   SpO2: SpO2: 96 % O2 Device:SpO2: 96 % O2 Flow Rate: .O2 Flow Rate (L/min): 2 L/min  IO: Intake/output summary:  Intake/Output Summary (Last 24 hours) at 11/01/2020 1201 Last data filed at 11/01/2020 0830 Gross per 24 hour  Intake 350 ml  Output 800 ml  Net -450 ml    LBM: Last BM Date: 10/30/20 Baseline Weight: Weight: 54.4 kg Most recent weight: Weight: 57.7 kg      Palliative Assessment/Data: PPS 20%     Time In: 1300 Time Out: 1412 Time Total: 72 minutes Greater than 50%  of this time was spent counseling and coordinating care related to the above assessment and plan.  Signed by: Lavena Bullion, NP   Please contact Palliative Medicine Team phone at [phone] for questions and concerns.  For individual provider: See Shea Evans

## 2020-11-01 NOTE — ED Notes (Signed)
Report given to Myriam Jacobson, [place].

## 2020-11-01 NOTE — ED Notes (Signed)
RT discussed respiratory POC w/MD and MD ordering lasix for fluid, MD request to hold off on BIPAP at this time to give lasix opportunity to work. RT will reassess need for BIPAP post Lasix administration. RT will continue to monitor.

## 2020-11-01 NOTE — Progress Notes (Signed)
Pt was recently positive for covid on 10/08/2020. Pt did receive infusion therapy. Dr. Wyline Copas aware still has positive result. Pt is no longer in infectious period >21 days out. Airborne not required. Carroll Kinds RN

## 2020-11-01 NOTE — ED Notes (Signed)
Unable to obtain urine specimen at this time. [place] in place.

## 2020-11-01 NOTE — H&P (Signed)
History and Physical    Kaitlyn Clark CWC:[id number] DOB: 1924-10-07 DOA: 10/11/2020  PCP: Jonathon Jordan, MD  Patient coming from: [facility]   Chief Complaint:  Chief Complaint  Patient presents with   Shortness of Breath   Chest Pain     HPI:    87 year old female with past medical history of advanced dementia, recent COVID infection (Dx 6/7), hypertension, hyperlipidemia, depression, chronic kidney disease stage IIIb, vitamin B12 deficiency, diastolic congestive heart failure (Echo 01/2016 EF 55-60% with G1DD), and basal cell carcinoma who presents to [place] floor as an incoming transfer from med center [facility] emergency department for NSTEMI.  Patient is a poor historian and unable to provide history due to advanced dementia.  Majority of history has been obtained from the daughter who happens to have medical POA at the bedside.  Daughter explains that yesterday morning the patient exhibited a sudden loss of appetite.  Patient refused to eat anything despite taking her mother to [employer] which she typically enjoys.  Shortly after breakfast the patient exhibited 1 episode of nonbilious nonbloody vomiting despite not eating anything.  Daughter also noticed that the patient seemed increasingly short of breath throughout the morning.  Daughter then brought the patient home to rest.  Due to persisting fatigue throughout the day and intermittent complaints of chest discomfort the initial intent was to bring the patient to a local urgent care clinic but instead they brought the patient to med center [facility] emergency department for evaluation.  Upon evaluation in the emergency department patient was found to have markedly elevated troponins as high as 22,983 consistent with NSTEMI.  Chest imaging revealed evidence of pulmonary edema.  Patient was administered 60 mg of intravenous Lasix.  Case was discussed with Dr. Jeannette Corpus with cardiology  who agreed with initiation of Lovenox and admission to [facility] for cardiology evaluation and consultation.  They recommended that the hospitalist group admit with cardiology consult.  Patient was then transferred to the cardiac telemetry unit at [facility].    Review of Systems:   ROS  Past Medical History:  Diagnosis Date   Aortic stenosis, mild    Aortic valve disorder    Basal cell carcinoma of scalp 03/14/2019   Carotid arterial disease (HCC)    Carotid artery stenosis    bilateral   Cerebral atherosclerosis    Chronic diastolic CHF (congestive heart failure) ([place]) 11/01/2020   Chronic kidney disease, stage 3b ([place]) 11/01/2020   CKD (chronic kidney disease), stage III ([place])    Dementia ([place]) 11/01/2020   Depression    Essential hypertension 01/15/2016   GERD (gastroesophageal reflux disease)    Hyperlipidemia    Hypertension    Renal artery stenosis (HCC)    Vertigo    Vitamin B deficiency     Past Surgical History:  Procedure Laterality Date   ABDOMINAL HYSTERECTOMY     APPENDECTOMY     CHOLECYSTECTOMY       reports that she has never smoked. She has never used smokeless tobacco. She reports previous alcohol use. She reports that she does not use drugs.  No Known Allergies  Family History  Problem Relation Age of Onset   Heart attack Father    Cancer Father    CAD Father    Colon cancer Neg Hx    Colon polyps Neg Hx    Liver disease Neg Hx      Prior to Admission medications  Medication Sig Start Date End Date Taking? Authorizing Provider  amLODipine (NORVASC) 2.5 MG tablet amlodipine 2.5 mg tablet  TK 1 T PO QD 01/16/16   [provider]  cyanocobalamin (,VITAMIN B-12,) 1000 MCG/ML injection INJECT 1ML IM Q MONTH 07/09/15   [provider]  imiquimod (ALDARA) 5 % cream imiquimod 5 % topical cream packet    [provider]  loratadine (CLARITIN) 10 MG tablet Claritin 10 mg tablet  Take 1 tablet every day by oral  route.    [provider]  propranolol (INDERAL) 10 MG tablet propranolol 10 mg tablet  TK 1 T PO BID OES    [provider]  VITAMIN D, CHOLECALCIFEROL, PO Take by mouth.    [provider]    Physical Exam: Vitals:   11/01/20 0037 11/01/20 0100 11/01/20 0153 11/01/20 0305  BP: (!) 126/96 (!) 140/109 (!) 139/103 (!) 150/101  Pulse: (!) 101 (!) 102 95 91  Resp: 17 17 (!) 35 16  Temp:    98.7 F (37.1 C)  TempSrc:      SpO2: 93% 90% 93% 99%  Weight:    57.7 kg  Height:    4\' 9"  (1.448 m)    Constitutional: Lethargic but arousable and oriented x1.  Patient is currently in mild respiratory distress.   Skin: no rashes, no lesions, good skin turgor noted. Eyes: Pupils are equally reactive to light.  No evidence of scleral icterus or conjunctival pallor.  ENMT: Moist mucous membranes noted.  Posterior pharynx clear of any exudate or lesions.   Neck: normal, supple, no masses, no thyromegaly.  No evidence of jugular venous distension.   Respiratory: Bibasilar mid field rales with intermittent expiratory wheezing noted.  Increased respiratory effort noted without accessory muscle use.  Cardiovascular: Regular rate and rhythm, no murmurs / rubs / gallops. No extremity edema. 2+ pedal pulses. No carotid bruits.  Chest:   Nontender without crepitus or deformity.   Back:   Nontender without crepitus or deformity. Abdomen: Abdomen is soft and nontender.  No evidence of intra-abdominal masses.  Positive bowel sounds noted in all quadrants.   Musculoskeletal: No joint deformity upper and lower extremities. Good ROM, no contractures. Normal muscle tone.  Neurologic: Lethargic arousable and oriented x1.  Patient consistently follow commands.  Patient spontaneously moves all 4 extremities.  Sensation is grossly intact. Psychiatric: Unable to assess due to advanced dementia.  Patient currently does not seem to possess insight as to her current situation.     Labs on  Admission: I have personally reviewed following labs and imaging studies -   CBC: Recent Labs  Lab 10/09/2020 1944  WBC 19.6*  HGB 12.4  HCT 36.6  MCV 89.1  PLT 937   Basic Metabolic Panel: Recent Labs  Lab 10/28/2020 1944  NA 138  K 3.3*  CL 99  CO2 25  GLUCOSE 130*  BUN 16  CREATININE 1.30*  CALCIUM 9.8   GFR: Estimated Creatinine Clearance: 19.7 mL/min (A) (by C-G formula based on SCr of 1.3 mg/dL (H)). Liver Function Tests: Recent Labs  Lab 10/30/2020 1944  AST 103*  ALT 22  ALKPHOS 91  BILITOT 1.0  PROT 7.1  ALBUMIN 4.2   No results for input(s): LIPASE, AMYLASE in the last 168 hours. No results for input(s): AMMONIA in the last 168 hours. Coagulation Profile: No results for input(s): INR, PROTIME in the last 168 hours. Cardiac Enzymes: No results for input(s): CKTOTAL, CKMB, CKMBINDEX, TROPONINI in the last  168 hours. BNP (last 3 results) No results for input(s): PROBNP in the last 8760 hours. HbA1C: No results for input(s): HGBA1C in the last 72 hours. CBG: No results for input(s): GLUCAP in the last 168 hours. Lipid Profile: No results for input(s): CHOL, HDL, LDLCALC, TRIG, CHOLHDL, LDLDIRECT in the last 72 hours. Thyroid Function Tests: No results for input(s): TSH, T4TOTAL, FREET4, T3FREE, THYROIDAB in the last 72 hours. Anemia Panel: No results for input(s): VITAMINB12, FOLATE, FERRITIN, TIBC, IRON, RETICCTPCT in the last 72 hours. Urine analysis: No results found for: COLORURINE, APPEARANCEUR, LABSPEC, PHURINE, GLUCOSEU, HGBUR, BILIRUBINUR, KETONESUR, PROTEINUR, UROBILINOGEN, NITRITE, LEUKOCYTESUR  Radiological Exams on Admission - Personally Reviewed: CT Angio Chest PE W and/or Wo Contrast  Result Date: 10/30/2020 CLINICAL DATA:  Pleurisy, elevated D-dimer EXAM: CT ANGIOGRAPHY CHEST WITH CONTRAST TECHNIQUE: Multidetector CT imaging of the chest was performed using the standard protocol during bolus administration of intravenous contrast.  Multiplanar CT image reconstructions and MIPs were obtained to evaluate the vascular anatomy. CONTRAST:  79mL OMNIPAQUE IOHEXOL 350 MG/ML SOLN COMPARISON:  07/27/2011 FINDINGS: Cardiovascular: There is adequate opacification of the pulmonary arterial tree. There is no intraluminal filling defect identified to suggest acute pulmonary embolism. The central pulmonary arteries are of normal caliber. Extensive multi-vessel coronary artery calcification. Cardiac size is mildly enlarged with mild left ventricular hypertrophy noted. Moderate atherosclerotic calcification is seen within the thoracic aorta. No aortic aneurysm. Mediastinum/Nodes: The thyroid gland is unremarkable. There is progressive shotty mediastinal adenopathy without frank pathologic enlargement, best appreciated within the high right paratracheal, prevascular, and aortopulmonary lymph node groups. The esophagus is unremarkable. Lungs/Pleura: Small bilateral pleural effusions are present. There is smooth interlobular septal thickening best appreciated at the lung bases and ground-glass pulmonary infiltrate seen within the lung apices bilaterally most in keeping with mild pulmonary edema, likely cardiogenic in nature. No pneumothorax. Central airways are widely patent. Upper Abdomen: No acute abnormality. Musculoskeletal: No acute bone abnormality. No lytic or blastic bone lesion. Degenerative changes are seen within the thoracic spine. Review of the MIP images confirms the above findings. IMPRESSION: No pulmonary embolism. Extensive multi-vessel coronary artery calcification. Mild left ventricular hypertrophy. Small bilateral pleural effusions and mild pulmonary edema most in keeping with mild cardiogenic failure. Aortic Atherosclerosis (ICD10-I70.0). Electronically Signed   By: Fidela Salisbury MD   On: 10/25/2020 23:18   DG Chest Port 1 View  Result Date: 10/02/2020 CLINICAL DATA:  Chest pain, generalized weakness, heavy breathing EXAM: PORTABLE CHEST  1 VIEW COMPARISON:  Radiograph 07/30/2004, CT 07/27/2011 FINDINGS: There is some chronically coarsened interstitial changes and bronchitic features throughout the lungs. No pneumothorax. Suspect small bilateral effusions. Faint patchy opacity is present in the retrocardiac and left lung periphery. The aorta is calcified. The remaining cardiomediastinal contours are unremarkable for portable technique. The osseous structures appear diffusely demineralized which may limit detection of small or nondisplaced fractures. No acute osseous abnormality or suspicious osseous lesion. No acute abnormality of the chest wall. Surgical clips in the right upper quadrant. Telemetry leads overlie the chest. IMPRESSION: Question some faint patchy opacity in the periphery of the left lung base and retrocardiac space. Could reflect atelectasis or airspace disease. Trace bilateral effusions. Background of diffuse chronically coarsened interstitial and bronchitic features. Aortic Atherosclerosis (ICD10-I70.0). Electronically Signed   By: Lovena Le M.D.   On: 10/11/2020 20:30    EKG: Personally reviewed.  Rhythm is normal sinus rhythm with heart rate of 90 bpm.  T wave inversions with ST segment depressions throughout the precordial  leads.     Assessment/Plan Principal Problem:   NSTEMI (non-ST elevated myocardial infarction) [facility])  Patient presenting with 24-hour history of worsening shortness of breath and intermittent chest discomfort. EKG revealing T wave inversions with ST segment depression throughout the precordial leads. Patient presented with troponin of 22,983 followed by 22,496 consistent with NSTEMI Patient received a dose of subcutaneous Lovenox at med center [place] .  We will transition patient to heparin infusion for now until family and cardiology can decide whether they will pursue cardiac catheterization. Based on my discussion with daughter, the seem to be leaning against intervention and therefore  patient's NSTEMI may be best treated medically. Placing patient on metoprolol 25 mg p.o. twice daily Placing patient on atorvastatin 40 mg p.o. daily Placing patient on aspirin 325 mg daily As needed nitroglycerin for further episodes of chest discomfort.  Active Problems: Acute on chronic diastolic CHF (congestive heart failure) ([place])  Patient presenting with markedly elevated BNP of 1391 with evidence of acute cardiogenic pulmonary edema on chest imaging Patient is already received 60 mg of IV Lasix in the emergency department prior to arrival on the medical floor Will reassess renal function with repeat chemistry after which we can decide on whether further dosing of intravenous diuretics is indicated.    Essential hypertension  Continue home regimen of amlodipine    Dementia without behavioral disturbance (HCC)  Minimize uncomfortable/painful stimuli Frequent redirection by nursing staff Encouraging family to remain at bedside is much as possible    Chronic kidney disease, stage 3b ([place])  Strict intake and output monitoring Minimizing nephrotoxic agents as much as possible Serial chemistries to monitor renal function and electrolytes    Mixed hyperlipidemia  Obtaining lipid panel this morning Atorvastatin 40 mg daily.     Hypokalemia  Oral replacement Monitoring potassium levels with serial chemistries    Leukocytosis  Leukocytosis of 19.6 Chest imaging reveals no evidence of pneumonia Urinalysis still pending  Goals of care discussion  Discussed goals of care with daughter at the bedside who happened to have medical POA Daughter is interested in palliative care consultation, referral placed Daughter is additionally leaning against any significant invasive intervention but is willing to discuss the possibility of cardiac catheterization with cardiology later this morning.  Recent COVID-19 infection.  Recently diagnosed with COVID-19 infection June 7 Patient  received Bebtelovimab June 8 Patient was retested for COVID on arrival to med center [place] however patient is well outside the isolation window and I no longer think that patient is suffering from clinically relevant disease.    Code Status:  Full code Family Communication: Discussed plan of care with daughter at bedside as detailed above  Status is: Inpatient  Remains inpatient appropriate because:Ongoing diagnostic testing needed not appropriate for outpatient work up, IV treatments appropriate due to intensity of illness or inability to take PO, and Inpatient level of care appropriate due to severity of illness  Dispo: The patient is from: Home              Anticipated d/c is to: Home              Patient currently is not medically stable to d/c.   Difficult to place patient Yes        Vernelle Emerald MD [facility] Pager [phone]  If 7PM-7AM, please contact night-coverage [email or web address] Use universal [place] password for that web site. If you do not have the password, please call the hospital operator.  11/01/2020, 6:29  AM    

## 2020-11-01 NOTE — Consult Note (Addendum)
Cardiology Consultation:   Patient ID: Kaitlyn Clark MRN: [id number]; DOB: 04-20-1925  Admit date: 10/15/2020 Date of Consult: 11/01/2020  PCP:  Jonathon Jordan, MD   [place] Providers Cardiologist:  New to [facility]   Patient Profile:   Kaitlyn Clark is a 87 y.o. female with a hx of past medical history of advanced dementia, recent COVID-80 infection, hypertension, hyperlipidemia, CKD stage III, chronic diastolic heart failure and history of basal cell carcinoma who is being seen 11/01/2020 for the evaluation of NSTEMI and acute CHF at the request of Dr. Wyline Copas.  History of Present Illness:   Kaitlyn Clark is a pleasant 87 year old female with past medical history of advanced dementia, recent COVID-19 infection, hypertension, hyperlipidemia, CKD stage III, chronic diastolic heart failure and history of basal cell carcinoma.  Patient has never seen cardiology service in the past.  In the past several months, she has been transferring between [place] and [place] to stay with her 2 daughters.  She was recently diagnosed with COVID-19 on 10/08/2020 after spending time in [place] and had close contact with her son-in-law who had COVID-19.  In the past several weeks, family has been noticing her breathing becoming more labored with exertion.  She does not do much strenuous activity in the recent weeks.  She has severe dementia and unable to recall the year, location or who is the president.  History is quite limited from the patient.  Yesterday, family had a hard time waking up the patient in the morning.  Her daughter brought her to [place] where she usually enjoyed eating, however she refused to eat anything yesterday.  Afterward she had 1 episode of nonbilious nonbloody vomiting.  Throughout the entire morning, she became more short of breath.  Family eventually decided to take her to [place].  On arrival, it was noted her troponin was 22,983.  BNP was 1391.4.   Creatinine was 1.3.  Potassium 3.3.  White blood cell count was 19.6.  D-dimer elevated at 1.5.  COVID test came back positive again however she is outside quarantine.  Chest x-ray showed trace bilateral effusion.  CTA of the chest was negative for PE, however revealed extensive multivessel CAD with small bilateral pleural effusion and mild pulmonary edema.  Overnight, serial troponin trended up to greater than 24,000.  White blood cell count went up to 20.2.  Creatinine 1.32.  Family has decided on no invasive study given her advanced age and severe dementia.   Past Medical History:  Diagnosis Date   Aortic stenosis, mild    Aortic valve disorder    Basal cell carcinoma of scalp 03/14/2019   Carotid arterial disease (HCC)    Carotid artery stenosis    bilateral   Cerebral atherosclerosis    Chronic diastolic CHF (congestive heart failure) ([place]) 11/01/2020   Chronic kidney disease, stage 3b ([place]) 11/01/2020   CKD (chronic kidney disease), stage III ([place])    Dementia ([place]) 11/01/2020   Depression    Essential hypertension 01/15/2016   GERD (gastroesophageal reflux disease)    Hyperlipidemia    Hypertension    Renal artery stenosis (HCC)    Vertigo    Vitamin B deficiency     Past Surgical History:  Procedure Laterality Date   ABDOMINAL HYSTERECTOMY     APPENDECTOMY     CHOLECYSTECTOMY       Home Medications:  Prior to Admission medications   Medication Sig Start Date End Date Taking? Authorizing Provider  amLODipine (NORVASC)  2.5 MG tablet Take 2.5 mg by mouth daily. 01/16/16  Yes [provider]  aspirin EC 81 MG tablet Take 81 mg by mouth daily. Swallow whole.   Yes [provider]  loratadine (CLARITIN) 10 MG tablet Take 10 mg by mouth daily.   Yes [provider]  propranolol (INDERAL) 10 MG tablet Take 10 mg by mouth 2 (two) times daily.   Yes [provider]  QUEtiapine (SEROQUEL) 25 MG tablet Take 25 mg by mouth at bedtime. 08/15/20  Yes  [provider]  VITAMIN D, CHOLECALCIFEROL, PO Take 1 tablet by mouth daily.   Yes [provider]    Inpatient Medications: Scheduled Meds:  amLODipine  2.5 mg Oral Daily   aspirin  325 mg Oral Daily   atorvastatin  40 mg Oral Daily   loratadine  10 mg Oral Daily   metoprolol tartrate  25 mg Oral BID   potassium chloride  20 mEq Oral BID   Continuous Infusions:  heparin 1,700 Units/hr (11/01/20 0823)   PRN Meds: acetaminophen, albuterol, nitroGLYCERIN, ondansetron (ZOFRAN) IV, polyethylene glycol  Allergies:   No Known Allergies  Social History:   Social History   Socioeconomic History   Marital status: Married    Spouse name: Not on file   Number of children: Not on file   Years of education: Not on file   Highest education level: Not on file  Occupational History   Not on file  Tobacco Use   Smoking status: Never   Smokeless tobacco: Never  Substance and Sexual Activity   Alcohol use: Not Currently    Alcohol/week: 0.0 standard drinks   Drug use: No   Sexual activity: Not on file  Other Topics Concern   Not on file  Social History Narrative   Not on file   Social Determinants of Health   Financial Resource Strain: Not on file  Food Insecurity: Not on file  Transportation Needs: Not on file  Physical Activity: Not on file  Stress: Not on file  Social Connections: Not on file  Intimate Partner Violence: Not on file    Family History:    Family History  Problem Relation Age of Onset   Heart attack Father    Cancer Father    CAD Father    Colon cancer Neg Hx    Colon polyps Neg Hx    Liver disease Neg Hx      ROS:  Please see the history of present illness.   All other ROS reviewed and negative.     Physical Exam/Data:   Vitals:   11/01/20 0153 11/01/20 0305 11/01/20 0748 11/01/20 1153  BP: (!) 139/103 (!) 150/101 (!) 139/98 119/80  Pulse: 95 91 92 75  Resp: (!) 35 16 19 18   Temp:  98.7 F (37.1 C) 98.1 F (36.7 C)  97.7 F (36.5 C)  TempSrc:  Oral Oral Oral  SpO2: 93% 99% 94% 96%  Weight:  57.7 kg    Height:  4\' 9"  (1.448 m)      Intake/Output Summary (Last 24 hours) at 11/01/2020 1239 Last data filed at 11/01/2020 0830 Gross per 24 hour  Intake 350 ml  Output 800 ml  Net -450 ml   Last 3 Weights 11/01/2020 10/30/2020 03/14/2019  Weight (lbs) 127 lb 3.3 oz 120 lb 131 lb 8 oz  Weight (kg) 57.7 kg 54.432 kg 59.648 kg     Body mass index is 27.53 kg/m.  General: Frail, demented HEENT:  normal Lymph: no adenopathy Neck: no JVD Endocrine:  No thryomegaly Vascular: No carotid bruits; FA pulses 2+ bilaterally without bruits  Cardiac:  normal S1, S2; RRR; no murmur  Lungs:  clear to auscultation bilaterally, no wheezing, rhonchi or rales  Abd: soft, nontender, no hepatomegaly  Ext: no edema Musculoskeletal:  No deformities, BUE and BLE strength normal and equal Skin: warm and dry  Neuro:  no focal abnormalities noted Psych: Flat affect  EKG:  The EKG was personally reviewed and demonstrates: Sinus rhythm with ST downsloping in the lateral leads. Telemetry:  Telemetry was personally reviewed and demonstrates: Normal sinus rhythm, no significant ventricular ectopy  Relevant CV Studies:  N/A  Laboratory Data:  High Sensitivity Troponin:   Recent Labs  Lab 10/23/2020 1944 10/16/2020 2130 11/01/20 0634  TROPONINIHS 22,983* 22,496* >24,000*     Chemistry Recent Labs  Lab 10/11/2020 1944 11/01/20 0634  NA 138 137  K 3.3* 3.1*  CL 99 100  CO2 25 25  GLUCOSE 130* 121*  BUN 16 15  CREATININE 1.30* 1.32*  CALCIUM 9.8 8.8*  GFRNONAA 38* 38*  ANIONGAP 14 12    Recent Labs  Lab 10/02/2020 1944 11/01/20 0634  PROT 7.1 6.2*  ALBUMIN 4.2 3.3*  AST 103* 131*  ALT 22 31  ALKPHOS 91 84  BILITOT 1.0 1.4*   Hematology Recent Labs  Lab 10/14/2020 1944 11/01/20 0634  WBC 19.6* 20.2*  RBC 4.11 4.00  HGB 12.4 11.9*  HCT 36.6 35.7*  MCV 89.1 89.3  MCH 30.2 29.8  MCHC 33.9 33.3  RDW 15.2  14.9  PLT 263 220   BNP Recent Labs  Lab 10/14/2020 1944  BNP 1,391.4*    DDimer  Recent Labs  Lab 10/22/2020 1943  DDIMER 1.50*     Radiology/Studies:  CT Angio Chest PE W and/or Wo Contrast  Result Date: 10/10/2020 CLINICAL DATA:  Pleurisy, elevated D-dimer EXAM: CT ANGIOGRAPHY CHEST WITH CONTRAST TECHNIQUE: Multidetector CT imaging of the chest was performed using the standard protocol during bolus administration of intravenous contrast. Multiplanar CT image reconstructions and MIPs were obtained to evaluate the vascular anatomy. CONTRAST:  89mL OMNIPAQUE IOHEXOL 350 MG/ML SOLN COMPARISON:  07/27/2011 FINDINGS: Cardiovascular: There is adequate opacification of the pulmonary arterial tree. There is no intraluminal filling defect identified to suggest acute pulmonary embolism. The central pulmonary arteries are of normal caliber. Extensive multi-vessel coronary artery calcification. Cardiac size is mildly enlarged with mild left ventricular hypertrophy noted. Moderate atherosclerotic calcification is seen within the thoracic aorta. No aortic aneurysm. Mediastinum/Nodes: The thyroid gland is unremarkable. There is progressive shotty mediastinal adenopathy without frank pathologic enlargement, best appreciated within the high right paratracheal, prevascular, and aortopulmonary lymph node groups. The esophagus is unremarkable. Lungs/Pleura: Small bilateral pleural effusions are present. There is smooth interlobular septal thickening best appreciated at the lung bases and ground-glass pulmonary infiltrate seen within the lung apices bilaterally most in keeping with mild pulmonary edema, likely cardiogenic in nature. No pneumothorax. Central airways are widely patent. Upper Abdomen: No acute abnormality. Musculoskeletal: No acute bone abnormality. No lytic or blastic bone lesion. Degenerative changes are seen within the thoracic spine. Review of the MIP images confirms the above findings. IMPRESSION: No  pulmonary embolism. Extensive multi-vessel coronary artery calcification. Mild left ventricular hypertrophy. Small bilateral pleural effusions and mild pulmonary edema most in keeping with mild cardiogenic failure. Aortic Atherosclerosis (ICD10-I70.0). Electronically Signed   By: Fidela Salisbury MD   On: 10/24/2020 23:18   DG Chest Port 1  View  Result Date: 10/29/2020 CLINICAL DATA:  Chest pain, generalized weakness, heavy breathing EXAM: PORTABLE CHEST 1 VIEW COMPARISON:  Radiograph 07/30/2004, CT 07/27/2011 FINDINGS: There is some chronically coarsened interstitial changes and bronchitic features throughout the lungs. No pneumothorax. Suspect small bilateral effusions. Faint patchy opacity is present in the retrocardiac and left lung periphery. The aorta is calcified. The remaining cardiomediastinal contours are unremarkable for portable technique. The osseous structures appear diffusely demineralized which may limit detection of small or nondisplaced fractures. No acute osseous abnormality or suspicious osseous lesion. No acute abnormality of the chest wall. Surgical clips in the right upper quadrant. Telemetry leads overlie the chest. IMPRESSION: Question some faint patchy opacity in the periphery of the left lung base and retrocardiac space. Could reflect atelectasis or airspace disease. Trace bilateral effusions. Background of diffuse chronically coarsened interstitial and bronchitic features. Aortic Atherosclerosis (ICD10-I70.0). Electronically Signed   By: Lovena Le M.D.   On: 10/18/2020 20:30     Assessment and Plan:   NSTEMI  -Serial troponin trended up to greater than 24,000.  -She started complaining of intermittent chest pain yesterday, however patient is a extremely poor historian, family has been noticing her breathing becoming labored for the past several weeks.  -Initial EKG showed ST segment depression in the lateral leads.  -Obtain echocardiogram.  Had extensive conversation with  daughter who is healthcare power of attorney.  Cardiac catheterization is unlikely to improve her quality of life given severe dementia.  Both her daughter and we agree with not pursuing any invasive study in this case.  -Continue IV heparin for 48 hours.  Consider aspirin and Plavix.  Currently on low-dose amlodipine and metoprolol.   -Palliative care is currently seeing the patient to discuss goals of care.  Patient is DNR.  Acute (likely systolic) heart failure: She still has some degree of pulmonary edema on exam.  Consider 20 mg IV Lasix x1.  Received a single 60 mg IV Lasix yesterday.  Hypertension: Blood pressure stable on the current therapy  Hyperlipidemia: Consider increase Lipitor to 80 mg daily  CKD stage III  Advanced dementia: Oriented to self only, does not recall the year, location or who is the current president of [place].  Recent COVID-19 infection  Leukocytosis: White blood cell count up to 20, she is unable to tell if she has any fever or chill.  Question if this is related to significant NSTEMI.  Hypokalemia: Start potassium chloride 20 meg twice a day.  Did not order 40 meq dosage as I am concerned with her swallowing   Risk Assessment/Risk Scores:     TIMI Risk Score for Unstable Angina or Non-ST Elevation MI:   The patient's TIMI risk score is 6, which indicates a 41% risk of all cause mortality, new or recurrent myocardial infarction or need for urgent revascularization in the next 14 days.  New York Heart Association (NYHA) Functional Class NYHA Class IV        For questions or updates, please contact [facility] Please consult [email or web address] for contact info under    Signed, Almyra Deforest, [place]  11/01/2020 12:39 PM  Patient seen, examined. Available data reviewed. Agree with findings, assessment, and plan as outlined by Almyra Deforest, PA.  Changes are made where appropriate.  The patient is independently interviewed and examined.  Her daughter is at the  bedside.  There is no history obtainable from the patient.  She denies any chest pain or pain elsewhere.  She denies shortness of  breath at present.  The patient is elderly, no distress.  HEENT is normal, JVP is moderately elevated, lungs are clear bilaterally, heart is regular rate and rhythm with no murmur or gallop, abdomen is soft and nontender with no masses, extremities have no pretibial edema, skin is warm and dry with no rash.  This elderly woman with advanced dementia presents with a posterior myocardial infarction based on her initial EKG.  On follow-up EKG, her ST changes have improved.  She is currently chest pain-free.  She has had a large infarct based on cardiac enzymes.  We are awaiting her 2D echocardiogram.  The patient has been made DNR.  There is a palliative medicine consultation in process.  The patient's family is leaning towards a palliative approach to her care at this time which I think is completely appropriate.  We will continue with medical therapy of her myocardial infarction with heparin, aspirin, clopidogrel, a low-dose beta-blocker, and a statin drug.  Would discontinue heparin after total treatment time of 48 hours.  If the patient declines further and/or the patient's family decides for a full palliative approach, I would recommend discontinuing her medical therapy and moving towards full comfort.  We also discussed the likelihood that the patient will require skilled nursing on discharge.  We discussed expectations around the patient's life expectancy, and while it is difficult to prognosticate at present, the patient's family is very realistic about her poor prognosis.  Sherren Mocha, M.D. 11/01/2020 2:01 PM

## 2020-11-01 NOTE — ED Notes (Signed)
RT assessed pts respiratory status at this time. Pt extremely wet sounding rhonchi/coarse crackles, appears to be fluid related. No wheezing noted at this time. Pt having more difficulty breathing w/increased WOB. MD aware. Pt is on [place] 2 Lpm at this time w/sats of 96%. RT will continue to monitor.

## 2020-11-01 NOTE — ED Notes (Signed)
Called  [facility] to transport patient to [facility] rm[facility][id number]

## 2020-11-01 NOTE — Progress Notes (Signed)
Date and time results received: 11/01/20 @0750   Test: Troponin Critical Value: >24000  Name of Provider Notified: Marylu Lund, MD  Orders Received? Or Actions Taken?: Awaiting orders

## 2020-11-01 DEATH — deceased

## 2020-11-02 DIAGNOSIS — N1832 Chronic kidney disease, stage 3b: Secondary | ICD-10-CM | POA: Diagnosis not present

## 2020-11-02 DIAGNOSIS — F0390 Unspecified dementia without behavioral disturbance: Secondary | ICD-10-CM | POA: Diagnosis not present

## 2020-11-02 DIAGNOSIS — I214 Non-ST elevation (NSTEMI) myocardial infarction: Secondary | ICD-10-CM | POA: Diagnosis not present

## 2020-11-02 DIAGNOSIS — I5032 Chronic diastolic (congestive) heart failure: Secondary | ICD-10-CM | POA: Diagnosis not present

## 2020-11-02 LAB — HEMOGLOBIN A1C
Hgb A1c MFr Bld: 5.6 % (ref 4.8–5.6)
Mean Plasma Glucose: 114 mg/dL

## 2020-11-02 LAB — HEPARIN LEVEL (UNFRACTIONATED)
Heparin Unfractionated: 0.35 IU/mL (ref 0.30–0.70)
Heparin Unfractionated: 0.21 IU/mL — ABNORMAL LOW (ref 0.30–0.70)

## 2020-11-02 LAB — BASIC METABOLIC PANEL
Anion gap: 13 (ref 5–15)
BUN: 22 mg/dL (ref 8–23)
CO2: 23 mmol/L (ref 22–32)
Calcium: 9 mg/dL (ref 8.9–10.3)
Chloride: 99 mmol/L (ref 98–111)
Creatinine, Ser: 1.43 mg/dL — ABNORMAL HIGH (ref 0.44–1.00)
GFR, Estimated: 34 mL/min — ABNORMAL LOW (ref >60)
Glucose, Bld: 138 mg/dL — ABNORMAL HIGH (ref 70–99)
Potassium: 3.2 mmol/L — ABNORMAL LOW (ref 3.5–5.1)
Sodium: 135 mmol/L (ref 135–145)

## 2020-11-02 LAB — CBC
HCT: 34.8 % — ABNORMAL LOW (ref 36.0–46.0)
Hemoglobin: 11.6 g/dL — ABNORMAL LOW (ref 12.0–15.0)
MCH: 29.7 pg (ref 26.0–34.0)
MCHC: 33.3 g/dL (ref 30.0–36.0)
MCV: 89 fL (ref 80.0–100.0)
Platelets: 233 10*3/uL (ref 150–400)
RBC: 3.91 MIL/uL (ref 3.87–5.11)
RDW: 15.5 % (ref 11.5–15.5)
WBC: 21.5 10*3/uL — ABNORMAL HIGH (ref 4.0–10.5)
nRBC: 0.2 % (ref 0.0–0.2)

## 2020-11-02 MED ORDER — METOPROLOL TARTRATE 25 MG PO TABS
25.0000 mg | ORAL_TABLET | Freq: Three times a day (TID) | ORAL | Status: DC
  Filled 2020-11-02: qty 1

## 2020-11-02 MED ORDER — LORAZEPAM 1 MG PO TABS: 1.0000 mg | ORAL_TABLET | ORAL | Status: DC | PRN

## 2020-11-02 MED ORDER — POLYVINYL ALCOHOL 1.4 % OP SOLN
1.0000 [drp] | Freq: Four times a day (QID) | OPHTHALMIC | Status: DC | PRN
  Filled 2020-11-02: qty 15

## 2020-11-02 MED ORDER — GLYCOPYRROLATE 0.2 MG/ML IJ SOLN: 0.2000 mg | INTRAMUSCULAR | Status: DC | PRN

## 2020-11-02 MED ORDER — MORPHINE SULFATE (PF) 2 MG/ML IV SOLN
1.0000 mg | INTRAVENOUS | Status: AC
  Administered 2020-11-02: 1 mg via INTRAVENOUS
  Filled 2020-11-02: qty 1

## 2020-11-02 MED ORDER — POTASSIUM CHLORIDE CRYS ER 20 MEQ PO TBCR: 20.0000 meq | EXTENDED_RELEASE_TABLET | Freq: Two times a day (BID) | ORAL | Status: DC

## 2020-11-02 MED ORDER — MORPHINE 100MG IN NS 100ML (1MG/ML) PREMIX INFUSION
2.0000 mg/h | INTRAVENOUS | Status: DC
  Administered 2020-11-02: 2 mg/h via INTRAVENOUS
  Administered 2020-11-02: 3 mg/h via INTRAVENOUS
  Administered 2020-11-03: 5 mg/h via INTRAVENOUS
  Filled 2020-11-02 (×2): qty 100

## 2020-11-02 MED ORDER — GLYCOPYRROLATE 1 MG PO TABS
1.0000 mg | ORAL_TABLET | ORAL | Status: DC | PRN
  Filled 2020-11-02: qty 1

## 2020-11-02 MED ORDER — HALOPERIDOL LACTATE 5 MG/ML IJ SOLN: 0.5000 mg | INTRAMUSCULAR | Status: DC | PRN

## 2020-11-02 MED ORDER — MORPHINE BOLUS VIA INFUSION
1.0000 mg | INTRAVENOUS | Status: DC | PRN
  Administered 2020-11-02: 2 mg via INTRAVENOUS
  Filled 2020-11-02: qty 2

## 2020-11-02 MED ORDER — HALOPERIDOL LACTATE 2 MG/ML PO CONC
0.5000 mg | ORAL | Status: DC | PRN
  Filled 2020-11-02: qty 0.3

## 2020-11-02 MED ORDER — FUROSEMIDE 10 MG/ML IJ SOLN
40.0000 mg | Freq: Once | INTRAMUSCULAR | Status: AC
  Administered 2020-11-02: 40 mg via INTRAVENOUS
  Filled 2020-11-02: qty 4

## 2020-11-02 MED ORDER — POTASSIUM CHLORIDE CRYS ER 20 MEQ PO TBCR
40.0000 meq | EXTENDED_RELEASE_TABLET | Freq: Two times a day (BID) | ORAL | Status: DC
  Administered 2020-11-02: 40 meq via ORAL
  Filled 2020-11-02: qty 2

## 2020-11-02 MED ORDER — LORAZEPAM 2 MG/ML PO CONC: 1.0000 mg | ORAL | Status: DC | PRN

## 2020-11-02 MED ORDER — LORAZEPAM 2 MG/ML IJ SOLN
0.5000 mg | INTRAMUSCULAR | Status: DC | PRN
  Administered 2020-11-02: 0.5 mg via INTRAVENOUS
  Filled 2020-11-02: qty 1

## 2020-11-02 MED ORDER — MORPHINE SULFATE (PF) 2 MG/ML IV SOLN
1.0000 mg | INTRAVENOUS | Status: DC | PRN
  Administered 2020-11-02: 1 mg via INTRAVENOUS
  Filled 2020-11-02: qty 1

## 2020-11-02 MED ORDER — HALOPERIDOL 0.5 MG PO TABS
0.5000 mg | ORAL_TABLET | ORAL | Status: DC | PRN
  Filled 2020-11-02: qty 1

## 2020-11-02 MED ORDER — BIOTENE DRY MOUTH MT LIQD: 15.0000 mL | OROMUCOSAL | Status: DC | PRN

## 2020-11-02 NOTE — Progress Notes (Signed)
ANTICOAGULATION CONSULT NOTE   Pharmacy Consult for Heparin Indication: chest pain/ACS  No Known Allergies  Patient Measurements: Height: 4\' 9"  (144.8 cm) Weight: 57.7 kg (127 lb 3.3 oz) IBW/kg (Calculated) : 38.6 Heparin Dosing Weight: 55 kg  Vital Signs: Temp: 97.9 F (36.6 C) (07/02 0829) Temp Source: Oral (07/02 0829) BP: 125/91 (07/02 0829) Pulse Rate: 107 (07/02 0900)  Labs: Recent Labs    10/17/2020 1944 10/24/2020 2130 11/01/20 0634 11/01/20 1638 11/02/20 0216 11/02/20 1217  HGB 12.4  --  11.9*  --  11.6*  --   HCT 36.6  --  35.7*  --  34.8*  --   PLT 263  --  220  --  233  --   HEPARINUNFRC  --   --   --  >1.10* 0.35 0.21*  CREATININE 1.30*  --  1.32*  --  1.43*  --   TROPONINIHS 22,983* 22,496* >24,000*  --   --   --     Estimated Creatinine Clearance: 17.9 mL/min (A) (by C-G formula based on SCr of 1.43 mg/dL (H)).   Medical History: Past Medical History:  Diagnosis Date   Aortic stenosis, mild    Aortic valve disorder    Basal cell carcinoma of scalp 03/14/2019   Carotid arterial disease (HCC)    Carotid artery stenosis    bilateral   Cerebral atherosclerosis    Chronic diastolic CHF (congestive heart failure) ([place]) 11/01/2020   Chronic kidney disease, stage 3b ([place]) 11/01/2020   CKD (chronic kidney disease), stage III (HCC)    Dementia ([place]) 11/01/2020   Depression    Essential hypertension 01/15/2016   GERD (gastroesophageal reflux disease)    Hyperlipidemia    Hypertension    Renal artery stenosis (HCC)    Vertigo    Vitamin B deficiency       Assessment: 87 y.o. female with NSTEMI. No anticoagulation prior to admission. Pharmacy consulted for heparin.    HL 0.21 is subtherapeutic on 700 units/hr. Planning 48 hrs heparin per cards (end 7/3 am)  Goal of Therapy:  Heparin level 0.3-0.7 units/ml Monitor platelets by anticoagulation protocol: Yes   Plan:  Increase heparin to 800 units/hr until 7/3 at 8AM per cardiology No further HL given  stopping heparin Monitor for signs/symptoms of bleeding    Benetta Spar, PharmD, BCPS, BCCP Clinical Pharmacist  Please check AMION for all [facility] phone numbers After 10:00 PM, call [place]

## 2020-11-02 NOTE — Plan of Care (Signed)
  Problem: Clinical Measurements: Goal: Respiratory complications will improve Outcome: Progressing Goal: Cardiovascular complication will be avoided Outcome: Progressing   Problem: Safety: Goal: Ability to remain free from injury will improve Outcome: Progressing   Problem: Activity: Goal: Risk for activity intolerance will decrease Outcome: Not Progressing

## 2020-11-02 NOTE — Progress Notes (Signed)
PROGRESS NOTE    Kaitlyn Clark  NWG:[id number] DOB: 04-27-1925 DOA: 10/25/2020 PCP: Jonathon Jordan, MD    Brief Narrative:  87 year old female with past medical history of advanced dementia, recent COVID infection (Dx 6/7), hypertension, hyperlipidemia, depression, chronic kidney disease stage IIIb, vitamin B12 deficiency, diastolic congestive heart failure (Echo 01/2016 EF 55-60% with G1DD), and basal cell carcinoma who presents to [facility] floor as an incoming transfer from med center [facility] emergency department for NSTEMI.  Assessment & Plan:   Principal Problem:   NSTEMI (non-ST elevated myocardial infarction) ([place]) Active Problems:   Essential hypertension   Dementia without behavioral disturbance (HCC)   Chronic kidney disease, stage 3b (HCC)   Mixed hyperlipidemia   Chronic diastolic CHF (congestive heart failure) (HCC)   Hypokalemia   Leukocytosis  Principal Problem:   NSTEMI (non-ST elevated myocardial infarction) [facility])  Patient presenting with 24-hour history of worsening shortness of breath and intermittent chest discomfort. EKG revealing T wave inversions with ST segment depression throughout the precordial leads. Patient presented with troponin of 22,983 followed by 22,496 consistent with NSTEMI, trop peaked to >24,000 Despite anticoagulation and maximal medical therapy, patient's condition continued to decline Appreciate input by Cardiology. Recommendations to focus on palliative care and hospice Palliative Care following. Plan to focus on only comfort measures. Now anticipating hospital death   Active Problems: Acute on chronic diastolic CHF (congestive heart failure) ([place])  Patient presenting with markedly elevated BNP of 1391 with evidence of acute cardiogenic pulmonary edema on chest imaging Worsening sob this AM, dit not improve with trial of IV lasix Now focus on comfort care     Essential hypertension  Was continued on  regimen of amlodipine Now focus on comfort care     Dementia without behavioral disturbance (HCC)  Stable Focus on comfort     Chronic kidney disease, stage 3b ([place])  Now focus on comfort measures only     Mixed hyperlipidemia  Had been on statin Now focus on comfort only     Hypokalemia  Replaced recently     Leukocytosis  Leukocytosis of 19.6 at presentation Now focus on comfort   Goals of care discussion  Appreciate assistance by Palliative Care With progressive decline, decision was made to transition to full comfort measures only. Anticipating hospital death Had met with several family members, who agree with plan of care   Recent COVID-19 infection.  Recently diagnosed with COVID-19 infection June 7 Patient received Bebtelovimab June 8 Covid test is pos, however, pt is within 21 day and 90 day window where pt would not be considered contagious. No need for isolation   DVT prophylaxis: Initially heparin gtt, now comfort measures Code Status: DNR Family Communication: Pt in room, family at bedside  Status is: Inpatient  Remains inpatient appropriate because:Inpatient level of care appropriate due to severity of illness  Dispo: The patient is from: Home              Anticipated d/c is to:  Anticipating hospital death              Patient currently is not medically stable to d/c.   Difficult to place patient No  Consultants:  Cardiology Palliative Care  Procedures:    Antimicrobials: Anti-infectives (From admission, onward)    None       Subjective: Without complaints this AM  Objective: Vitals:   11/01/20 1928 11/02/20 0139 11/02/20 0829 11/02/20 0900  BP: 124/67 (!) 151/110 (!) 125/91  Pulse:  94 (!) 114 (!) 107  Resp:  (!) 22 (!) 22 (!) 30  Temp: 97.9 F (36.6 C) 97.9 F (36.6 C) 97.9 F (36.6 C)   TempSrc: Oral Oral Oral   SpO2:  92%    Weight:      Height:        Intake/Output Summary (Last 24 hours) at 11/02/2020 1733 Last data  filed at 11/02/2020 0400 Gross per 24 hour  Intake 44.97 ml  Output 500 ml  Net -455.03 ml    Filed Weights   10/13/2020 1936 11/01/20 0305  Weight: 54.4 kg 57.7 kg    Examination: General exam: Conversant, in no acute distress Respiratory system: increased resp effort, no audible wheezing Cardiovascular system: regular rhythm, s1-s2 Gastrointestinal system: Nondistended, nontender, pos BS Central nervous system: No seizures, no tremors Extremities: No cyanosis, no joint deformities Skin: No rashes, no pallor Psychiatry: Affect normal // no auditory hallucinations   Data Reviewed: I have personally reviewed following labs and imaging studies  CBC: Recent Labs  Lab 10/06/2020 1944 11/01/20 0634 11/02/20 0216  WBC 19.6* 20.2* 21.5*  NEUTROABS  --  14.9*  --   HGB 12.4 11.9* 11.6*  HCT 36.6 35.7* 34.8*  MCV 89.1 89.3 89.0  PLT 263 220 850    Basic Metabolic Panel: Recent Labs  Lab 10/02/2020 1944 11/01/20 0634 11/02/20 0216  NA 138 137 135  K 3.3* 3.1* 3.2*  CL 99 100 99  CO2 25 25 23   GLUCOSE 130* 121* 138*  BUN 16 15 22   CREATININE 1.30* 1.32* 1.43*  CALCIUM 9.8 8.8* 9.0  MG  --  1.5*  --     GFR: Estimated Creatinine Clearance: 17.9 mL/min (A) (by C-G formula based on SCr of 1.43 mg/dL (H)). Liver Function Tests: Recent Labs  Lab 10/23/2020 1944 11/01/20 0634  AST 103* 131*  ALT 22 31  ALKPHOS 91 84  BILITOT 1.0 1.4*  PROT 7.1 6.2*  ALBUMIN 4.2 3.3*    No results for input(s): LIPASE, AMYLASE in the last 168 hours. No results for input(s): AMMONIA in the last 168 hours. Coagulation Profile: No results for input(s): INR, PROTIME in the last 168 hours. Cardiac Enzymes: No results for input(s): CKTOTAL, CKMB, CKMBINDEX, TROPONINI in the last 168 hours. BNP (last 3 results) No results for input(s): PROBNP in the last 8760 hours. HbA1C: Recent Labs    11/01/20 0634  HGBA1C 5.6   CBG: No results for input(s): GLUCAP in the last 168 hours. Lipid  Profile: Recent Labs    11/01/20 0634  CHOL 235*  HDL 50  LDLCALC 161*  TRIG 122  CHOLHDL 4.7    Thyroid Function Tests: No results for input(s): TSH, T4TOTAL, FREET4, T3FREE, THYROIDAB in the last 72 hours. Anemia Panel: No results for input(s): VITAMINB12, FOLATE, FERRITIN, TIBC, IRON, RETICCTPCT in the last 72 hours. Sepsis Labs: No results for input(s): PROCALCITON, LATICACIDVEN in the last 168 hours.  Recent Results (from the past 240 hour(s))  Resp Panel by RT-PCR (Flu A&B, Covid) Nasopharyngeal Swab     Status: Abnormal   Collection Time: 11/01/20 12:00 AM   Specimen: Nasopharyngeal Swab; Nasopharyngeal(NP) swabs in vial transport medium  Result Value Ref Range Status   SARS Coronavirus 2 by RT PCR POSITIVE (A) NEGATIVE Final    Comment: RESULT CALLED TO, READ BACK BY AND VERIFIED WITH: Charm Rings, RN 11/01/20 @ 0114 BY AV (NOTE) SARS-CoV-2 target nucleic acids are DETECTED.  The SARS-CoV-2 RNA is generally detectable  in upper respiratory specimens during the acute phase of infection. Positive results are indicative of the presence of the identified virus, but do not rule out bacterial infection or co-infection with other pathogens not detected by the test. Clinical correlation with patient history and other diagnostic information is necessary to determine patient infection status. The expected result is Negative.  Fact Sheet for Patients: EntrepreneurPulse.com.au  Fact Sheet for Healthcare Providers: IncredibleEmployment.be  This test is not yet approved or cleared by the [place] FDA and  has been authorized for detection and/or diagnosis of SARS-CoV-2 by FDA under an Emergency Use Authorization (EUA).  This EUA will remain in effect (meaning this test can be  used) for the duration of  the COVID-19 declaration under Section 564(b)(1) of the Act, 21 U.S.C. section 360bbb-3(b)(1), unless the authorization is terminated or  revoked sooner.     Influenza A by PCR NEGATIVE NEGATIVE Final   Influenza B by PCR NEGATIVE NEGATIVE Final    Comment: (NOTE) The Xpert Xpress SARS-CoV-2/FLU/RSV plus assay is intended as an aid in the diagnosis of influenza from Nasopharyngeal swab specimens and should not be used as a sole basis for treatment. Nasal washings and aspirates are unacceptable for Xpert Xpress SARS-CoV-2/FLU/RSV testing.  Fact Sheet for Patients: EntrepreneurPulse.com.au  Fact Sheet for Healthcare Providers: IncredibleEmployment.be  This test is not yet approved or cleared by the [place] FDA and has been authorized for detection and/or diagnosis of SARS-CoV-2 by FDA under an Emergency Use Authorization (EUA). This EUA will remain in effect (meaning this test can be used) for the duration of the COVID-19 declaration under Section 564(b)(1) of the Act, 21 U.S.C. section 360bbb-3(b)(1), unless the authorization is terminated or revoked.  Performed at [facility], [address], [place], [place] [address]       Radiology Studies: CT Angio Chest PE W and/or Wo Contrast  Result Date: 10/02/2020 CLINICAL DATA:  Pleurisy, elevated D-dimer EXAM: CT ANGIOGRAPHY CHEST WITH CONTRAST TECHNIQUE: Multidetector CT imaging of the chest was performed using the standard protocol during bolus administration of intravenous contrast. Multiplanar CT image reconstructions and MIPs were obtained to evaluate the vascular anatomy. CONTRAST:  23m OMNIPAQUE IOHEXOL 350 MG/ML SOLN COMPARISON:  07/27/2011 FINDINGS: Cardiovascular: There is adequate opacification of the pulmonary arterial tree. There is no intraluminal filling defect identified to suggest acute pulmonary embolism. The central pulmonary arteries are of normal caliber. Extensive multi-vessel coronary artery calcification. Cardiac size is mildly enlarged with mild left ventricular hypertrophy noted.  Moderate atherosclerotic calcification is seen within the thoracic aorta. No aortic aneurysm. Mediastinum/Nodes: The thyroid gland is unremarkable. There is progressive shotty mediastinal adenopathy without frank pathologic enlargement, best appreciated within the high right paratracheal, prevascular, and aortopulmonary lymph node groups. The esophagus is unremarkable. Lungs/Pleura: Small bilateral pleural effusions are present. There is smooth interlobular septal thickening best appreciated at the lung bases and ground-glass pulmonary infiltrate seen within the lung apices bilaterally most in keeping with mild pulmonary edema, likely cardiogenic in nature. No pneumothorax. Central airways are widely patent. Upper Abdomen: No acute abnormality. Musculoskeletal: No acute bone abnormality. No lytic or blastic bone lesion. Degenerative changes are seen within the thoracic spine. Review of the MIP images confirms the above findings. IMPRESSION: No pulmonary embolism. Extensive multi-vessel coronary artery calcification. Mild left ventricular hypertrophy. Small bilateral pleural effusions and mild pulmonary edema most in keeping with mild cardiogenic failure. Aortic Atherosclerosis (ICD10-I70.0). Electronically Signed   By: AFidela SalisburyMD   On: 10/04/2020 23:18  DG Chest Port 1 View  Result Date: 10/08/2020 CLINICAL DATA:  Chest pain, generalized weakness, heavy breathing EXAM: PORTABLE CHEST 1 VIEW COMPARISON:  Radiograph 07/30/2004, CT 07/27/2011 FINDINGS: There is some chronically coarsened interstitial changes and bronchitic features throughout the lungs. No pneumothorax. Suspect small bilateral effusions. Faint patchy opacity is present in the retrocardiac and left lung periphery. The aorta is calcified. The remaining cardiomediastinal contours are unremarkable for portable technique. The osseous structures appear diffusely demineralized which may limit detection of small or nondisplaced fractures. No acute  osseous abnormality or suspicious osseous lesion. No acute abnormality of the chest wall. Surgical clips in the right upper quadrant. Telemetry leads overlie the chest. IMPRESSION: Question some faint patchy opacity in the periphery of the left lung base and retrocardiac space. Could reflect atelectasis or airspace disease. Trace bilateral effusions. Background of diffuse chronically coarsened interstitial and bronchitic features. Aortic Atherosclerosis (ICD10-I70.0). Electronically Signed   By: Lovena Le M.D.   On: 10/18/2020 20:30   ECHOCARDIOGRAM COMPLETE  Result Date: 11/01/2020    ECHOCARDIOGRAM REPORT   Patient Name:   CATTIE TINEO Date of Exam: 11/01/2020 Medical Rec #:  [id number]         Height:       57.0 in Accession #:    [id number]        Weight:       127.2 lb Date of Birth:  1924-08-16          BSA:          1.484 m Patient Age:    4 years          BP:           150/101 mmHg Patient Gender: F                 HR:           91 bpm. Exam Location:  Inpatient Procedure: 2D Echo, Cardiac Doppler and Color Doppler Indications:    NSTEMI  History:        Patient has no prior history of Echocardiogram examinations.                 Aortic Valve Disease; Risk Factors:Hypertension.  Sonographer:    Cammy Brochure Referring Phys: [id number]  1. Left ventricular ejection fraction, by estimation, is 50 to 55%. The left ventricle has low normal function. The left ventricle has no regional wall motion abnormalities. Left ventricular diastolic parameters are consistent with Grade III diastolic dysfunction (restrictive).  2. Right ventricular systolic function is moderately reduced. The right ventricular size is normal.  3. The mitral valve is grossly normal. Mild mitral valve regurgitation.  4. The aortic valve is calcified. There is moderate calcification of the aortic valve. There is moderate thickening of the aortic valve. Aortic valve regurgitation is not visualized. No aortic  stenosis is present. FINDINGS  Left Ventricle: Left ventricular ejection fraction, by estimation, is 50 to 55%. The left ventricle has low normal function. The left ventricle has no regional wall motion abnormalities. The left ventricular internal cavity size was normal in size. There is no left ventricular hypertrophy. Left ventricular diastolic parameters are consistent with Grade III diastolic dysfunction (restrictive). Right Ventricle: The right ventricular size is normal. Right vetricular wall thickness was not well visualized. Right ventricular systolic function is moderately reduced. Left Atrium: Left atrial size was normal in size. Right Atrium: Right atrial size was normal in size. Pericardium: There is  no evidence of pericardial effusion. Mitral Valve: The mitral valve is grossly normal. Mild mitral valve regurgitation. Tricuspid Valve: The tricuspid valve is grossly normal. Tricuspid valve regurgitation is not demonstrated. Aortic Valve: The aortic valve is calcified. There is moderate calcification of the aortic valve. There is moderate thickening of the aortic valve. There is moderate aortic valve annular calcification. Aortic valve regurgitation is not visualized. No aortic stenosis is present. Aortic valve mean gradient measures 2.0 mmHg. Aortic valve peak gradient measures 3.5 mmHg. Aortic valve area, by VTI measures 1.93 cm. Pulmonic Valve: The pulmonic valve was grossly normal. Pulmonic valve regurgitation is trivial. Aorta: The aortic root and ascending aorta are structurally normal, with no evidence of dilitation. IAS/Shunts: The atrial septum is grossly normal.  LEFT VENTRICLE PLAX 2D LVIDd:         4.40 cm  Diastology LVIDs:         3.50 cm  LV e' medial:    4.57 cm/s LV PW:         0.90 cm  LV E/e' medial:  13.3 LV IVS:        0.90 cm  LV e' lateral:   6.31 cm/s LVOT diam:     1.80 cm  LV E/e' lateral: 9.6 LV SV:         35 LV SV Index:   24 LVOT Area:     2.54 cm  RIGHT VENTRICLE             IVC RV Basal diam:  3.20 cm    IVC diam: 1.60 cm RV S prime:     6.85 cm/s TAPSE (M-mode): 1.1 cm LEFT ATRIUM             Index       RIGHT ATRIUM           Index LA diam:        3.10 cm 2.09 cm/m  RA Area:     11.60 cm LA Vol (A2C):   44.9 ml 30.26 ml/m RA Volume:   24.60 ml  16.58 ml/m LA Vol (A4C):   44.6 ml 30.05 ml/m LA Biplane Vol: 47.8 ml 32.21 ml/m  AORTIC VALVE AV Area (Vmax):    1.89 cm AV Area (Vmean):   1.84 cm AV Area (VTI):     1.93 cm AV Vmax:           93.80 cm/s AV Vmean:          64.500 cm/s AV VTI:            0.183 m AV Peak Grad:      3.5 mmHg AV Mean Grad:      2.0 mmHg LVOT Vmax:         69.70 cm/s LVOT Vmean:        46.600 cm/s LVOT VTI:          0.139 m LVOT/AV VTI ratio: 0.76  AORTA Ao Root diam: 2.70 cm Ao Asc diam:  3.50 cm MITRAL VALVE MV Area (PHT): 4.15 cm    SHUNTS MV Decel Time: 183 msec    Systemic VTI:  0.14 m MV E velocity: 60.80 cm/s  Systemic Diam: 1.80 cm MV A velocity: 50.30 cm/s MV E/A ratio:  1.21 Mertie Moores MD Electronically signed by Mertie Moores MD Signature Date/Time: 11/01/2020/3:19:50 PM    Final     Scheduled Meds:   Continuous Infusions:  morphine       LOS: 1 day  Marylu Lund, MD [facility] Pager On [place]  If 7PM-7AM, please contact night-coverage 11/02/2020, 5:33 PM

## 2020-11-02 NOTE — Progress Notes (Signed)
ANTICOAGULATION CONSULT NOTE   Pharmacy Consult for Heparin Indication: chest pain/ACS  No Known Allergies  Patient Measurements: Height: 4\' 9"  (144.8 cm) Weight: 57.7 kg (127 lb 3.3 oz) IBW/kg (Calculated) : 38.6 Heparin Dosing Weight: 55 kg  Vital Signs: Temp: 97.9 F (36.6 C) (07/02 0139) Temp Source: Oral (07/02 0139) BP: 151/110 (07/02 0139) Pulse Rate: 94 (07/02 0139)  Labs: Recent Labs    10/16/2020 1944 10/10/2020 2130 11/01/20 0634 11/01/20 1638 11/02/20 0216  HGB 12.4  --  11.9*  --  11.6*  HCT 36.6  --  35.7*  --  34.8*  PLT 263  --  220  --  233  HEPARINUNFRC  --   --   --  >1.10* 0.35  CREATININE 1.30*  --  1.32*  --  1.43*  TROPONINIHS 22,983* 22,496* >24,000*  --   --      Estimated Creatinine Clearance: 17.9 mL/min (A) (by C-G formula based on SCr of 1.43 mg/dL (H)).   Medical History: Past Medical History:  Diagnosis Date   Aortic stenosis, mild    Aortic valve disorder    Basal cell carcinoma of scalp 03/14/2019   Carotid arterial disease (HCC)    Carotid artery stenosis    bilateral   Cerebral atherosclerosis    Chronic diastolic CHF (congestive heart failure) ([place]) 11/01/2020   Chronic kidney disease, stage 3b ([place]) 11/01/2020   CKD (chronic kidney disease), stage III (HCC)    Dementia ([place]) 11/01/2020   Depression    Essential hypertension 01/15/2016   GERD (gastroesophageal reflux disease)    Hyperlipidemia    Hypertension    Renal artery stenosis (HCC)    Vertigo    Vitamin B deficiency       Assessment: 87 y.o. female with NSTEMI. No anticoagulation prior to admission. Pharmacy consulted for heparin.    First HL > 1.1 on 1700 units/hr. Heparin is running in L AC and level was drawn from R hand.  No issues with infusion or bleeding per RN.  7/2 AM update:  Heparin level therapeutic this AM   Goal of Therapy:  Heparin level 0.3-0.7 units/ml Monitor platelets by anticoagulation protocol: Yes   Plan:  Cont heparin 700  units/hr 1200 heparin level  Narda Bonds, PharmD, BCPS Clinical Pharmacist Phone: [phone]

## 2020-11-02 NOTE — Progress Notes (Signed)
   11/02/20 1645  Clinical Encounter Type  Visited With Patient and family together  Visit Type Patient actively dying  Referral From Nurse  Consult/Referral To Chaplain   Chaplain responded to page from nurse, Gregary Signs. The patient's daughters, Vaughan Basta and Tye [place], were at her bedside. Chaplain engaged in active listening as the daughters expressed their difficulty in seeing their mother struggle. Chaplain provided grief support and comfort. their concern grieved Chaplain provided grief support. This note was prepared by Jeanine Luz, M.Div..  For questions please contact by phone [phone].

## 2020-11-02 NOTE — Progress Notes (Signed)
Progress Note  Patient Name: Kaitlyn Clark Date of Encounter: 11/02/2020  Primary Cardiologist:   None   Subjective   Demented.  Cheyne Stokes respirations.  She says she has pain but no cannot localize.   Inpatient Medications    Scheduled Meds:  amLODipine  2.5 mg Oral Daily   aspirin EC  81 mg Oral Daily   atorvastatin  40 mg Oral Daily   clopidogrel  75 mg Oral Daily   loratadine  10 mg Oral Daily   metoprolol tartrate  25 mg Oral BID   [START ON 11/29/2020] potassium chloride  20 mEq Oral BID   potassium chloride  40 mEq Oral BID   Continuous Infusions:  heparin 700 Units/hr (11/02/20 0915)   PRN Meds: acetaminophen, albuterol, nitroGLYCERIN, ondansetron (ZOFRAN) IV, polyethylene glycol   Vital Signs    Vitals:   11/01/20 1928 11/02/20 0139 11/02/20 0829 11/02/20 0900  BP: 124/67 (!) 151/110 (!) 125/91   Pulse:  94 (!) 114 (!) 107  Resp:  (!) 22 (!) 22 (!) 30  Temp: 97.9 F (36.6 C) 97.9 F (36.6 C) 97.9 F (36.6 C)   TempSrc: Oral Oral Oral   SpO2:  92%    Weight:      Height:        Intake/Output Summary (Last 24 hours) at 11/02/2020 1048 Last data filed at 11/02/2020 0400 Gross per 24 hour  Intake 44.97 ml  Output 500 ml  Net -455.03 ml   Filed Weights   10/13/2020 1936 11/01/20 0305  Weight: 54.4 kg 57.7 kg    Telemetry    Sinus tachycardia  - Personally Reviewed  ECG    NA - Personally Reviewed  Physical Exam   GEN: No acute distress.   Neck: No  JVD Cardiac: RRR, no murmurs, rubs, or gallops.  Respiratory:     Left greater than right basilar crackles GI: Soft, nontender, non-distended  MS: No  edema; No deformity. Neuro:  Nonfocal  Psych: Normal affect   Labs    Chemistry Recent Labs  Lab 10/03/2020 1944 11/01/20 0634 11/02/20 0216  NA 138 137 135  K 3.3* 3.1* 3.2*  CL 99 100 99  CO2 25 25 23   GLUCOSE 130* 121* 138*  BUN 16 15 22   CREATININE 1.30* 1.32* 1.43*  CALCIUM 9.8 8.8* 9.0  PROT 7.1 6.2*  --   ALBUMIN 4.2  3.3*  --   AST 103* 131*  --   ALT 22 31  --   ALKPHOS 91 84  --   BILITOT 1.0 1.4*  --   GFRNONAA 38* 38* 34*  ANIONGAP 14 12 13      Hematology Recent Labs  Lab 10/13/2020 1944 11/01/20 0634 11/02/20 0216  WBC 19.6* 20.2* 21.5*  RBC 4.11 4.00 3.91  HGB 12.4 11.9* 11.6*  HCT 36.6 35.7* 34.8*  MCV 89.1 89.3 89.0  MCH 30.2 29.8 29.7  MCHC 33.9 33.3 33.3  RDW 15.2 14.9 15.5  PLT 263 220 233    Cardiac EnzymesNo results for input(s): TROPONINI in the last 168 hours. No results for input(s): TROPIPOC in the last 168 hours.   BNP Recent Labs  Lab 10/16/2020 1944  BNP 1,391.4*     DDimer  Recent Labs  Lab 10/16/2020 1943  DDIMER 1.50*     Radiology    CT Angio Chest PE W and/or Wo Contrast  Result Date: 10/10/2020 CLINICAL DATA:  Pleurisy, elevated D-dimer EXAM: CT ANGIOGRAPHY CHEST WITH CONTRAST TECHNIQUE: Multidetector  CT imaging of the chest was performed using the standard protocol during bolus administration of intravenous contrast. Multiplanar CT image reconstructions and MIPs were obtained to evaluate the vascular anatomy. CONTRAST:  64mL OMNIPAQUE IOHEXOL 350 MG/ML SOLN COMPARISON:  07/27/2011 FINDINGS: Cardiovascular: There is adequate opacification of the pulmonary arterial tree. There is no intraluminal filling defect identified to suggest acute pulmonary embolism. The central pulmonary arteries are of normal caliber. Extensive multi-vessel coronary artery calcification. Cardiac size is mildly enlarged with mild left ventricular hypertrophy noted. Moderate atherosclerotic calcification is seen within the thoracic aorta. No aortic aneurysm. Mediastinum/Nodes: The thyroid gland is unremarkable. There is progressive shotty mediastinal adenopathy without frank pathologic enlargement, best appreciated within the high right paratracheal, prevascular, and aortopulmonary lymph node groups. The esophagus is unremarkable. Lungs/Pleura: Small bilateral pleural effusions are present.  There is smooth interlobular septal thickening best appreciated at the lung bases and ground-glass pulmonary infiltrate seen within the lung apices bilaterally most in keeping with mild pulmonary edema, likely cardiogenic in nature. No pneumothorax. Central airways are widely patent. Upper Abdomen: No acute abnormality. Musculoskeletal: No acute bone abnormality. No lytic or blastic bone lesion. Degenerative changes are seen within the thoracic spine. Review of the MIP images confirms the above findings. IMPRESSION: No pulmonary embolism. Extensive multi-vessel coronary artery calcification. Mild left ventricular hypertrophy. Small bilateral pleural effusions and mild pulmonary edema most in keeping with mild cardiogenic failure. Aortic Atherosclerosis (ICD10-I70.0). Electronically Signed   By: Fidela Salisbury MD   On: 10/20/2020 23:18   DG Chest Port 1 View  Result Date: 10/14/2020 CLINICAL DATA:  Chest pain, generalized weakness, heavy breathing EXAM: PORTABLE CHEST 1 VIEW COMPARISON:  Radiograph 07/30/2004, CT 07/27/2011 FINDINGS: There is some chronically coarsened interstitial changes and bronchitic features throughout the lungs. No pneumothorax. Suspect small bilateral effusions. Faint patchy opacity is present in the retrocardiac and left lung periphery. The aorta is calcified. The remaining cardiomediastinal contours are unremarkable for portable technique. The osseous structures appear diffusely demineralized which may limit detection of small or nondisplaced fractures. No acute osseous abnormality or suspicious osseous lesion. No acute abnormality of the chest wall. Surgical clips in the right upper quadrant. Telemetry leads overlie the chest. IMPRESSION: Question some faint patchy opacity in the periphery of the left lung base and retrocardiac space. Could reflect atelectasis or airspace disease. Trace bilateral effusions. Background of diffuse chronically coarsened interstitial and bronchitic  features. Aortic Atherosclerosis (ICD10-I70.0). Electronically Signed   By: Lovena Le M.D.   On: 10/09/2020 20:30   ECHOCARDIOGRAM COMPLETE  Result Date: 11/01/2020    ECHOCARDIOGRAM REPORT   Patient Name:   Kaitlyn Clark Date of Exam: 11/01/2020 Medical Rec #:  [id number]         Height:       57.0 in Accession #:    [id number]        Weight:       127.2 lb Date of Birth:  15-Jul-1924          BSA:          1.484 m Patient Age:    3 years          BP:           150/101 mmHg Patient Gender: F                 HR:           91 bpm. Exam Location:  Inpatient Procedure: 2D Echo, Cardiac Doppler and Color Doppler Indications:  NSTEMI  History:        Patient has no prior history of Echocardiogram examinations.                 Aortic Valve Disease; Risk Factors:Hypertension.  Sonographer:    Cammy Brochure Referring Phys: [id number] [place]  1. Left ventricular ejection fraction, by estimation, is 50 to 55%. The left ventricle has low normal function. The left ventricle has no regional wall motion abnormalities. Left ventricular diastolic parameters are consistent with Grade III diastolic dysfunction (restrictive).  2. Right ventricular systolic function is moderately reduced. The right ventricular size is normal.  3. The mitral valve is grossly normal. Mild mitral valve regurgitation.  4. The aortic valve is calcified. There is moderate calcification of the aortic valve. There is moderate thickening of the aortic valve. Aortic valve regurgitation is not visualized. No aortic stenosis is present. FINDINGS  Left Ventricle: Left ventricular ejection fraction, by estimation, is 50 to 55%. The left ventricle has low normal function. The left ventricle has no regional wall motion abnormalities. The left ventricular internal cavity size was normal in size. There is no left ventricular hypertrophy. Left ventricular diastolic parameters are consistent with Grade III diastolic dysfunction  (restrictive). Right Ventricle: The right ventricular size is normal. Right vetricular wall thickness was not well visualized. Right ventricular systolic function is moderately reduced. Left Atrium: Left atrial size was normal in size. Right Atrium: Right atrial size was normal in size. Pericardium: There is no evidence of pericardial effusion. Mitral Valve: The mitral valve is grossly normal. Mild mitral valve regurgitation. Tricuspid Valve: The tricuspid valve is grossly normal. Tricuspid valve regurgitation is not demonstrated. Aortic Valve: The aortic valve is calcified. There is moderate calcification of the aortic valve. There is moderate thickening of the aortic valve. There is moderate aortic valve annular calcification. Aortic valve regurgitation is not visualized. No aortic stenosis is present. Aortic valve mean gradient measures 2.0 mmHg. Aortic valve peak gradient measures 3.5 mmHg. Aortic valve area, by VTI measures 1.93 cm. Pulmonic Valve: The pulmonic valve was grossly normal. Pulmonic valve regurgitation is trivial. Aorta: The aortic root and ascending aorta are structurally normal, with no evidence of dilitation. IAS/Shunts: The atrial septum is grossly normal.  LEFT VENTRICLE PLAX 2D LVIDd:         4.40 cm  Diastology LVIDs:         3.50 cm  LV e' medial:    4.57 cm/s LV PW:         0.90 cm  LV E/e' medial:  13.3 LV IVS:        0.90 cm  LV e' lateral:   6.31 cm/s LVOT diam:     1.80 cm  LV E/e' lateral: 9.6 LV SV:         35 LV SV Index:   24 LVOT Area:     2.54 cm  RIGHT VENTRICLE            IVC RV Basal diam:  3.20 cm    IVC diam: 1.60 cm RV S prime:     6.85 cm/s TAPSE (M-mode): 1.1 cm LEFT ATRIUM             Index       RIGHT ATRIUM           Index LA diam:        3.10 cm 2.09 cm/m  RA Area:     11.60 cm LA Vol (A2C):  44.9 ml 30.26 ml/m RA Volume:   24.60 ml  16.58 ml/m LA Vol (A4C):   44.6 ml 30.05 ml/m LA Biplane Vol: 47.8 ml 32.21 ml/m  AORTIC VALVE AV Area (Vmax):    1.89 cm AV  Area (Vmean):   1.84 cm AV Area (VTI):     1.93 cm AV Vmax:           93.80 cm/s AV Vmean:          64.500 cm/s AV VTI:            0.183 m AV Peak Grad:      3.5 mmHg AV Mean Grad:      2.0 mmHg LVOT Vmax:         69.70 cm/s LVOT Vmean:        46.600 cm/s LVOT VTI:          0.139 m LVOT/AV VTI ratio: 0.76  AORTA Ao Root diam: 2.70 cm Ao Asc diam:  3.50 cm MITRAL VALVE MV Area (PHT): 4.15 cm    SHUNTS MV Decel Time: 183 msec    Systemic VTI:  0.14 m MV E velocity: 60.80 cm/s  Systemic Diam: 1.80 cm MV A velocity: 50.30 cm/s MV E/A ratio:  1.21 Mertie Moores MD Electronically signed by Mertie Moores MD Signature Date/Time: 11/01/2020/3:19:50 PM    Final     Cardiac Studies   ECHO:  1. Left ventricular ejection fraction, by estimation, is 50 to 55%. The  left ventricle has low normal function. The left ventricle has no regional  wall motion abnormalities. Left ventricular diastolic parameters are  consistent with Grade III diastolic  dysfunction (restrictive).   2. Right ventricular systolic function is moderately reduced. The right  ventricular size is normal.   3. The mitral valve is grossly normal. Mild mitral valve regurgitation.   4. The aortic valve is calcified. There is moderate calcification of the  aortic valve. There is moderate thickening of the aortic valve. Aortic  valve regurgitation is not visualized. No aortic stenosis is present.   Patient Profile     87 y.o. female with a hx of past medical history of advanced dementia, recent COVID-19 infection, hypertension, hyperlipidemia, CKD stage III, chronic diastolic heart failure and history of basal cell carcinoma who is being seen 11/01/2020 for the evaluation of NSTEMI and acute CHF at the request of Dr. Wyline Copas.  Assessment & Plan     NSTEMI:  Echo EF as above. Conservative therapy given comorbid conditions.  IV heparin to continue for a total of 48 hours.   Continue ASA.   I am going to try to titrate her beta blocker slightly.     Acute diastolic HF:    Net negative about 1 liter.   She got Lasix this morning because of acute dyspnea.   Continue to dose as needed.   Creat is up slightly.    Hypertension:   This is being managed in the context of treating her angina.    Hyperlipidemia: Consider increase Lipitor to 80 mg daily   CKD stage III:    Creat is mildly elevated.  Follow    Advanced dementia   Hypokalemia:      She is to get 80 meq po Kdur today.  Follow    I was able to meet with two of the daughters while palliative medicine was in the room.  We had an excellent discussion and I think that she would be very appropriate for hospice  care at [facility] place if possible.  I don't think her acute situation is reversible and we can pursue comfort care.    For questions or updates, please contact [place] Please consult [email or web address] for contact info under Cardiology/STEMI.   Signed, Minus Breeding, MD  11/02/2020, 10:48 AM

## 2020-11-02 NOTE — Progress Notes (Signed)
Daily Progress Note   Patient Name: Kaitlyn Clark       Date: 11/02/2020 DOB: 22-Jun-1924  Age: 87 y.o. MRN#: [id number] Attending Physician: Donne Hazel, MD Primary Care Physician: Jonathon Jordan, MD Admit Date: 10/30/2020  Reason for Consultation/Follow-up: Establishing goals of care  Subjective: 11:00 - Update received from bedside RN; patient is having labored breathing and crackles. She is administering IV lasix as ordered by Dr. Wyline Copas. Patient initially appeared to have some relief from this, but continued to have dyspnea.   The difference between full scope medical intervention and comfort care was discussed.  Reviewed the concept of a comfort path with family, emphasizing that this path involves de-escalating and stopping full scope medical interventions, allowing a natural course to occur. Discussed that the goal is comfort and dignity rather than cure/prolonging life.   Discussed transitioning to comfort care while in the hospital, and what that would look like--keeping her clean and dry, no labs, no artificial hydration or feeding, no antibiotics, minimizing of medications, comfort feeds, medication for pain and dyspnea as needed.   Daughters Vaughan Basta and [place] need time to discuss with their other sister Maudie Mercury, and apparently there are some difficult family dynamics. Vaughan Basta is also having a very difficult time, as she is still grieving her husband who passed away 6 months ago.  13:30 - Returned to bedside. Patient continue to appear uncomfortable with labored breathing. Family continues to struggle with the decision to transition to comfort, but ultimately have accepted that she is EOL and they do do want her to suffer. Comfort orders entered and notified Dr. Wyline Copas and bedside RN. Advised  RN to administer IV morphine.  17:15 - Returned to bedside. Patient continues to appear uncomfortable with labored breathing. Education provided indications for opioids to treat dyspnea at EOL. Recommended starting a morphine infusion - family agrees.    Length of Stay: 1  Current Medications: Scheduled Meds:   amLODipine  2.5 mg Oral Daily   aspirin EC  81 mg Oral Daily   atorvastatin  40 mg Oral Daily   clopidogrel  75 mg Oral Daily   loratadine  10 mg Oral Daily   metoprolol tartrate  25 mg Oral TID   [START ON 25-Nov-2020] potassium chloride  20 mEq Oral BID   potassium chloride  40  mEq Oral BID    Continuous Infusions:  heparin 700 Units/hr (11/02/20 0915)    PRN Meds: acetaminophen, albuterol, nitroGLYCERIN, ondansetron (ZOFRAN) IV, polyethylene glycol  Physical Exam Vitals reviewed.  Constitutional:      General: She is in acute distress.     Appearance: She is ill-appearing.  Cardiovascular:     Rate and Rhythm: Normal rate.  Pulmonary:     Effort: Tachypnea and accessory muscle usage present.     Breath sounds: Wheezing present.  Neurological:     Mental Status: She is alert. She is confused.  Psychiatric:        Mood and Affect: Mood is anxious.            Vital Signs: BP (!) 125/91 (BP Location: Right Arm)   Pulse (!) 107   Temp 97.9 F (36.6 C) (Oral)   Resp (!) 30   Ht 4\' 9"  (1.448 m)   Wt 57.7 kg   SpO2 92%   BMI 27.53 kg/m  SpO2: SpO2: 92 % O2 Device: O2 Device: Nasal Cannula O2 Flow Rate: O2 Flow Rate (L/min): 2 L/min  Intake/output summary:  Intake/Output Summary (Last 24 hours) at 11/02/2020 1331 Last data filed at 11/02/2020 0400 Gross per 24 hour  Intake 44.97 ml  Output 500 ml  Net -455.03 ml   LBM: Last BM Date: 10/30/20 Baseline Weight: Weight: 54.4 kg Most recent weight: Weight: 57.7 kg       Palliative Assessment/Data: PPS 20%      Patient Active Problem List   Diagnosis Date Noted   Dementia without behavioral disturbance  ([place]) 11/01/2020   Chronic kidney disease, stage 3b ([place]) 11/01/2020   Mixed hyperlipidemia 11/01/2020   Chronic diastolic CHF (congestive heart failure) ([place]) 11/01/2020   Hypokalemia 11/01/2020   Leukocytosis 11/01/2020   NSTEMI (non-ST elevated myocardial infarction) ([place]) 10/06/2020   COVID-19 virus detected 10/08/2020   Basal cell carcinoma of scalp 03/14/2019   Essential hypertension 01/15/2016    Palliative Care Assessment & Plan   HPI/Patient Profile: 87 y.o. female  with past medical history of advanced dementia, recent COVID infection (07/04/33), diastolic congestive heart failure (echo 01/2016 with EF 55-60% and grade 1 diastolic dysfunction), chronic kidney disease stage IIIb, hypertension, hyperlipidemia, vitamin B12 deficiency, and basal cell carcinoma. She presented to [facility] emergency department on 10/08/2020 with chest pain and persistent fatigue throughout the day. In the ED, patient was found to have significantly elevated troponin as high as 22,983 consistent with NSTEMI. Chest x-ray revealed evidence of pulmonary edema. Patient was transferred to [facility].    Assessment: Patient admitted with massive MI and is now acutely decompensating and approaching end of life.   Recommendations/Plan: Full comfort measures initiated DNR/DNI as previously documented Transfer to [facility] when bed becomes available - TOC consult placed Added orders for symptom management at EOL as well as discontinued orders that were not focused on comfort Unrestricted visitation orders were placed  PRN medications available to ensure EOL comfort PMT will continue to follow holistically   Goals of Care and Additional Recommendations: Limitations on Scope of Treatment: Full Comfort Care  Code Status: DNR/DNI  Prognosis:  Hours - Days  Discharge Planning: Residential hospice versus hospital death   Thank you for allowing the Palliative Medicine Team to assist in the care of this  patient.   Total Time 70 minutes Prolonged Time Billed  yes       Greater than 50%  of this time was spent counseling and  coordinating care related to the above assessment and plan.  Lavena Bullion, NP  Please contact Palliative Medicine Team phone at [phone] for questions and concerns.

## 2020-11-03 DIAGNOSIS — I214 Non-ST elevation (NSTEMI) myocardial infarction: Secondary | ICD-10-CM | POA: Diagnosis not present

## 2020-11-03 DIAGNOSIS — F0390 Unspecified dementia without behavioral disturbance: Secondary | ICD-10-CM | POA: Diagnosis not present

## 2020-11-03 DIAGNOSIS — I5032 Chronic diastolic (congestive) heart failure: Secondary | ICD-10-CM | POA: Diagnosis not present

## 2020-11-03 DIAGNOSIS — N1832 Chronic kidney disease, stage 3b: Secondary | ICD-10-CM | POA: Diagnosis not present

## 2020-11-04 MED FILL — Heparin Sodium (Porcine) Inj 5000 Unit/ML: INTRAMUSCULAR | Qty: 5.6 | Status: AC

## 2020-11-04 MED FILL — Sodium Chloride IV Soln 0.9%: INTRAVENOUS | Qty: 250 | Status: AC

## 2020-12-02 NOTE — Progress Notes (Signed)
Daily Progress Note   Patient Name: Kaitlyn Clark       Date: 2020/11/11 DOB: 1925/03/28  Age: 87 y.o. MRN#: [id number] Attending Physician: Donne Hazel, MD Primary Care Physician: Jonathon Jordan, MD Admit Date: 10/05/2020  Reason for Consultation/Follow-up: end of life care, symptom management Subjective: Patient appears comfortable. Morphine infusion at 5 mg/hr. Unresponsive to voice and light touch. No non-verbal signs of pain or discomfort noted. Respirations are even and unlabored. No excessive respiratory secretions noted. Family present at bedside. Education and counseling provided on expectations at EOL. Emotional support provided.   Family requests transfer to [facility] when a bed is available. Discussed that patient appears to be stable for transfer at this time. Discussed that transfer could be canceled at any time if patient became too unstable.  After speaking with hospice liaison, I informed family that a bed is not available today.    Length of Stay: 2  Current Medications: Scheduled Meds:    Continuous Infusions:  morphine 5 mg/hr (11-11-2020 1206)    PRN Meds: acetaminophen, albuterol, antiseptic oral rinse, glycopyrrolate **OR** glycopyrrolate **OR** glycopyrrolate, haloperidol **OR** haloperidol **OR** haloperidol lactate, LORazepam **OR** LORazepam **OR** LORazepam, morphine injection, morphine, nitroGLYCERIN, ondansetron (ZOFRAN) IV, polyethylene glycol, polyvinyl alcohol     Vital Signs: BP (!) 83/56 (BP Location: Left Arm)   Pulse (!) 106   Temp (!) 97.3 F (36.3 C) (Axillary)   Resp 15   Ht 4\' 9"  (1.448 m)   Wt 57.7 kg   SpO2 92%   BMI 27.53 kg/m  SpO2: SpO2: 92 % O2 Device: O2 Device: Room Air O2 Flow Rate: O2 Flow Rate (L/min): 2  L/min  Intake/output summary:  Intake/Output Summary (Last 24 hours) at 2020/11/11 1309 Last data filed at 11/02/2020 2359 Gross per 24 hour  Intake 16.49 ml  Output --  Net 16.49 ml   LBM: Last BM Date: 10/30/20 Baseline Weight: Weight: 54.4 kg Most recent weight: Weight: 57.7 kg       Palliative Assessment/Data: PPS 10%      Palliative Care Assessment & Plan   HPI/Patient Profile: 87 y.o. female  with past medical history of advanced dementia, recent COVID infection (11/08/65), diastolic congestive heart failure (echo 01/2016 with EF 55-60% and grade 1 diastolic dysfunction), chronic kidney disease stage IIIb, hypertension, hyperlipidemia, vitamin  B12 deficiency, and basal cell carcinoma. She presented to [facility] emergency department on 10/04/2020 with chest pain and persistent fatigue throughout the day. In the ED, patient was found to have significantly elevated troponin as high as 22,983 consistent with NSTEMI. Chest x-ray revealed evidence of pulmonary edema. Patient was transferred to [facility].    Assessment: Patient admitted with massive MI and now at EOL.   Recommendations/Plan: Continue full comfort care Continue morphine infusion  PRN medications available to ensure EOL comfort Transfer to [facility] when bed is available Transfer to 6N  Goals of Care and Additional Recommendations: Limitations on Scope of Treatment: Full Comfort Care  Code Status: DNR/DNI  Prognosis:  days  Discharge Planning: Hospice facility versus hospital death   Thank you for allowing the Palliative Medicine Team to assist in the care of this patient.   Total Time 15 minutes Prolonged Time Billed  no       Greater than 50%  of this time was spent counseling and coordinating care related to the above assessment and plan.  Lavena Bullion, NP  Please contact Palliative Medicine Team phone at [phone] for questions and concerns.

## 2020-12-02 NOTE — Social Work (Signed)
CSW was alerted that family is seeking a placement at [facility]. CSW spoke with pt's daughter Vaughan Basta and she confirmed request. [facility] representative Chryslyn has been alerted through chat about referral.

## 2020-12-02 NOTE — Progress Notes (Signed)
PROGRESS NOTE    Kaitlyn Clark  HMC:[id number] DOB: Sep 18, 1924 DOA: 10/16/2020 PCP: Jonathon Jordan, MD    Brief Narrative:  87 year old female with past medical history of advanced dementia, recent COVID infection (Dx 6/7), hypertension, hyperlipidemia, depression, chronic kidney disease stage IIIb, vitamin B12 deficiency, diastolic congestive heart failure (Echo 01/2016 EF 55-60% with G1DD), and basal cell carcinoma who presents to [place] floor as an incoming transfer from med center [facility] emergency department for NSTEMI.  Assessment & Plan:   Principal Problem:   NSTEMI (non-ST elevated myocardial infarction) ([place]) Active Problems:   Essential hypertension   Dementia without behavioral disturbance (HCC)   Chronic kidney disease, stage 3b (HCC)   Mixed hyperlipidemia   Chronic diastolic CHF (congestive heart failure) (HCC)   Hypokalemia   Leukocytosis  Principal Problem:   NSTEMI (non-ST elevated myocardial infarction) [facility])  Patient presenting with 24-hour history of worsening shortness of breath and intermittent chest discomfort. EKG revealing T wave inversions with ST segment depression throughout the precordial leads. Patient presented with troponin of 22,983 followed by 22,496 consistent with NSTEMI, trop peaked to >24,000 Despite anticoagulation and maximal medical therapy, patient's condition continued to decline Appreciate input by Cardiology. Recommendations to focus on palliative care and hospice Palliative Care following. Plan to focus on only comfort measures. No beds today at residential hospice. Cont to follow   Active Problems: Acute on chronic diastolic CHF (congestive heart failure) ([place])  Patient presenting with markedly elevated BNP of 1391 with evidence of acute cardiogenic pulmonary edema on chest imaging Worsening sob this AM, dit not improve with trial of IV lasix Cont to focus on comfort care     Essential  hypertension  Was continued on regimen of amlodipine Now focus on comfort care     Dementia without behavioral disturbance (HCC)  Stable Focus on comfort     Chronic kidney disease, stage 3b ([place])  Now focus on comfort measures only     Mixed hyperlipidemia  Had been on statin Now focus on comfort only     Hypokalemia  Replaced recently     Leukocytosis  Leukocytosis of 19.6 at presentation Now focus on comfort   Goals of care discussion  Appreciate assistance by Palliative Care With progressive decline, decision was made to transition to full comfort measures only. Anticipating hospital death Had met with several family members, who agree with plan of care   Recent COVID-19 infection.  Recently diagnosed with COVID-19 infection June 7 Patient received Bebtelovimab June 8 Covid test is pos, however, pt is within 21 day and 90 day window where pt would not be considered contagious. No need for isolation   DVT prophylaxis: Initially heparin gtt, now comfort measures Code Status: DNR Family Communication: Pt in room, family remains at bedside  Status is: Inpatient  Remains inpatient appropriate because:Inpatient level of care appropriate due to severity of illness  Dispo: The patient is from: Home              Anticipated d/c is to:  Hospice              Patient currently is not medically stable to d/c.   Difficult to place patient No  Consultants:  Cardiology Palliative Care  Procedures:    Antimicrobials: Anti-infectives (From admission, onward)    None       Subjective: Unable to assess given mentation  Objective: Vitals:   11/02/20 0829 11/02/20 0900 11/02/20 2042 11-12-20 1040  BP: Marland Kitchen)  125/91  114/73 (!) 83/56  Pulse: (!) 114 (!) 107 (!) 105 (!) 106  Resp: (!) 22 (!) 30 (!) 22 15  Temp: 97.9 F (36.6 C)  99.2 F (37.3 C) (!) 97.3 F (36.3 C)  TempSrc: Oral  Axillary Axillary  SpO2:    92%  Weight:      Height:        Intake/Output  Summary (Last 24 hours) at 11/29/20 1620 Last data filed at 11/02/2020 2359 Gross per 24 hour  Intake 16.49 ml  Output --  Net 16.49 ml    Filed Weights   10/28/2020 1936 11/01/20 0305  Weight: 54.4 kg 57.7 kg    Examination: General exam:asleep, laying in bed, in nad Respiratory system: Normal respiratory effort, no wheezing  Data Reviewed: I have personally reviewed following labs and imaging studies  CBC: Recent Labs  Lab 10/05/2020 1944 11/01/20 0634 11/02/20 0216  WBC 19.6* 20.2* 21.5*  NEUTROABS  --  14.9*  --   HGB 12.4 11.9* 11.6*  HCT 36.6 35.7* 34.8*  MCV 89.1 89.3 89.0  PLT 263 220 008    Basic Metabolic Panel: Recent Labs  Lab 10/23/2020 1944 11/01/20 0634 11/02/20 0216  NA 138 137 135  K 3.3* 3.1* 3.2*  CL 99 100 99  CO2 _0 GLUCOSE 130* 121* 138*  BUN _1 CREATININE 1.30* 1.32* 1.43*  CALCIUM 9.8 8.8* 9.0  MG  --  1.5*  --     GFR: Estimated Creatinine Clearance: 17.9 mL/min (A) (by C-G formula based on SCr of 1.43 mg/dL (H)). Liver Function Tests: Recent Labs  Lab 10/11/2020 1944 11/01/20 0634  AST 103* 131*  ALT 22 31  ALKPHOS 91 84  BILITOT 1.0 1.4*  PROT 7.1 6.2*  ALBUMIN 4.2 3.3*    No results for input(s): LIPASE, AMYLASE in the last 168 hours. No results for input(s): AMMONIA in the last 168 hours. Coagulation Profile: No results for input(s): INR, PROTIME in the last 168 hours. Cardiac Enzymes: No results for input(s): CKTOTAL, CKMB, CKMBINDEX, TROPONINI in the last 168 hours. BNP (last 3 results) No results for input(s): PROBNP in the last 8760 hours. HbA1C: Recent Labs    11/01/20 0634  HGBA1C 5.6    CBG: No results for input(s): GLUCAP in the last 168 hours. Lipid Profile: Recent Labs    11/01/20 0634  CHOL 235*  HDL 50  LDLCALC 161*  TRIG 122  CHOLHDL 4.7    Thyroid Function Tests: No results for input(s): TSH, T4TOTAL, FREET4, T3FREE, THYROIDAB in the last 72 hours. Anemia Panel: No results  for input(s): VITAMINB12, FOLATE, FERRITIN, TIBC, IRON, RETICCTPCT in the last 72 hours. Sepsis Labs: No results for input(s): PROCALCITON, LATICACIDVEN in the last 168 hours.  Recent Results (from the past 240 hour(s))  Resp Panel by RT-PCR (Flu A&B, Covid) Nasopharyngeal Swab     Status: Abnormal   Collection Time: 11/01/20 12:00 AM   Specimen: Nasopharyngeal Swab; Nasopharyngeal(NP) swabs in vial transport medium  Result Value Ref Range Status   SARS Coronavirus 2 by RT PCR POSITIVE (A) NEGATIVE Final    Comment: RESULT CALLED TO, READ BACK BY AND VERIFIED WITH: Charm Rings, RN 11/01/20 @ 0114 BY AV (NOTE) SARS-CoV-2 target nucleic acids are DETECTED.  The SARS-CoV-2 RNA is generally detectable in upper respiratory specimens during the acute phase of infection. Positive results are indicative of the presence of the identified virus, but do not rule out bacterial infection or  co-infection with other pathogens not detected by the test. Clinical correlation with patient history and other diagnostic information is necessary to determine patient infection status. The expected result is Negative.  Fact Sheet for Patients: EntrepreneurPulse.com.au  Fact Sheet for Healthcare Providers: IncredibleEmployment.be  This test is not yet approved or cleared by the [place] FDA and  has been authorized for detection and/or diagnosis of SARS-CoV-2 by FDA under an Emergency Use Authorization (EUA).  This EUA will remain in effect (meaning this test can be  used) for the duration of  the COVID-19 declaration under Section 564(b)(1) of the Act, 21 U.S.C. section 360bbb-3(b)(1), unless the authorization is terminated or revoked sooner.     Influenza A by PCR NEGATIVE NEGATIVE Final   Influenza B by PCR NEGATIVE NEGATIVE Final    Comment: (NOTE) The Xpert Xpress SARS-CoV-2/FLU/RSV plus assay is intended as an aid in the diagnosis of influenza from  Nasopharyngeal swab specimens and should not be used as a sole basis for treatment. Nasal washings and aspirates are unacceptable for Xpert Xpress SARS-CoV-2/FLU/RSV testing.  Fact Sheet for Patients: EntrepreneurPulse.com.au  Fact Sheet for Healthcare Providers: IncredibleEmployment.be  This test is not yet approved or cleared by the [place] FDA and has been authorized for detection and/or diagnosis of SARS-CoV-2 by FDA under an Emergency Use Authorization (EUA). This EUA will remain in effect (meaning this test can be used) for the duration of the COVID-19 declaration under Section 564(b)(1) of the Act, 21 U.S.C. section 360bbb-3(b)(1), unless the authorization is terminated or revoked.  Performed at [facility], [address] [place], [place] [address]       Radiology Studies: No results found.  Scheduled Meds:   Continuous Infusions:  morphine 5 mg/hr (11-04-20 1206)     LOS: 2 days   Marylu Lund, MD [facility] Pager On Amion  If 7PM-7AM, please contact night-coverage 11-04-20, 4:20 PM

## 2020-12-02 NOTE — Discharge Summary (Signed)
Death Summary  Kaitlyn Clark VFI:[id number] DOB: 08/29/1924 DOA: 2020-11-13  PCP: Jonathon Jordan, MD  Admit date: 13-Nov-2020 Date of Death: 11/16/20 Time of Death: Jul 10, 2128 Notification: Jonathon Jordan, MD notified of death of Nov 17, 2020   History of present illness:  Patient was a 87 year old female with past medical history of advanced dementia, recent COVID infection (Dx 6/7), hypertension, hyperlipidemia, depression, chronic kidney disease stage IIIb, vitamin B12 deficiency, diastolic congestive heart failure (Echo 01/2016 EF 55-60% with G1DD), and basal cell carcinoma who presented to [place] floor as an incoming transfer from med center [facility] emergency department for NSTEMI.  Final Diagnoses:  Principal Problem:   NSTEMI (non-ST elevated myocardial infarction) [facility])  Patient presented with 24-hour history of worsening shortness of breath and intermittent chest discomfort. EKG revealed T wave inversions with ST segment depression throughout the precordial leads. Patient presented with troponin of 22,983 followed by 22,496 consistent with NSTEMI, trop peaked to >24,000 Despite anticoagulation and maximal medical therapy, patient's condition continued to decline Appreciate input by Cardiology. Recommendations were noted to focus on palliative care and hospice Palliative Care was consulted. Patient was continued on comfort measures only   Active Problems: Acute on chronic diastolic CHF (congestive heart failure) ([place])  Patient presented with markedly elevated BNP of 1391 with evidence of acute cardiogenic pulmonary edema on chest imaging Despite trials of IV lasix, patient's respiratory status progressively worsened Focus was for comfort only     Essential hypertension  Was continued on regimen of amlodipine Later focus on comfort care     Dementia without behavioral disturbance (HCC)  Stable Focused on comfort     Chronic kidney disease, stage 3b  ([place])  Later focused on comfort measures only     Mixed hyperlipidemia  Had been on statin Now focus on comfort only     Hypokalemia  Was given replacement      Leukocytosis  Leukocytosis of 19.6 at presentation Focus was later on comfort only   Goals of care discussion with End of Life, DNR/Comfort Care Appreciate assistance by Palliative Care With progressive decline, decision was made to transition to full comfort measures only. Had met with several family members, who agree with plan of care On the evening of 11/16/2020 at 07/10/2128, patient was pronounced with family at bedside   Recent COVID-19 infection.  Recently diagnosed with COVID-19 infection June 7 Patient received Bebtelovimab June 8 Covid test was pos, however, pt was within 21 day and 90 day window where pt would not be considered contagious     The results of significant diagnostics from this hospitalization (including imaging, microbiology, ancillary and laboratory) are listed below for reference.    Significant Diagnostic Studies: CT Angio Chest PE W and/or Wo Contrast  Result Date: 2020/11/13 CLINICAL DATA:  Pleurisy, elevated D-dimer EXAM: CT ANGIOGRAPHY CHEST WITH CONTRAST TECHNIQUE: Multidetector CT imaging of the chest was performed using the standard protocol during bolus administration of intravenous contrast. Multiplanar CT image reconstructions and MIPs were obtained to evaluate the vascular anatomy. CONTRAST:  31m OMNIPAQUE IOHEXOL 350 MG/ML SOLN COMPARISON:  07/27/2011 FINDINGS: Cardiovascular: There is adequate opacification of the pulmonary arterial tree. There is no intraluminal filling defect identified to suggest acute pulmonary embolism. The central pulmonary arteries are of normal caliber. Extensive multi-vessel coronary artery calcification. Cardiac size is mildly enlarged with mild left ventricular hypertrophy noted. Moderate atherosclerotic calcification is seen within the thoracic aorta. No aortic  aneurysm. Mediastinum/Nodes: The thyroid gland is unremarkable. There is  progressive shotty mediastinal adenopathy without frank pathologic enlargement, best appreciated within the high right paratracheal, prevascular, and aortopulmonary lymph node groups. The esophagus is unremarkable. Lungs/Pleura: Small bilateral pleural effusions are present. There is smooth interlobular septal thickening best appreciated at the lung bases and ground-glass pulmonary infiltrate seen within the lung apices bilaterally most in keeping with mild pulmonary edema, likely cardiogenic in nature. No pneumothorax. Central airways are widely patent. Upper Abdomen: No acute abnormality. Musculoskeletal: No acute bone abnormality. No lytic or blastic bone lesion. Degenerative changes are seen within the thoracic spine. Review of the MIP images confirms the above findings. IMPRESSION: No pulmonary embolism. Extensive multi-vessel coronary artery calcification. Mild left ventricular hypertrophy. Small bilateral pleural effusions and mild pulmonary edema most in keeping with mild cardiogenic failure. Aortic Atherosclerosis (ICD10-I70.0). Electronically Signed   By: Fidela Salisbury MD   On: 10/07/2020 23:18   DG Chest Port 1 View  Result Date: 10/10/2020 CLINICAL DATA:  Chest pain, generalized weakness, heavy breathing EXAM: PORTABLE CHEST 1 VIEW COMPARISON:  Radiograph 07/30/2004, CT 07/27/2011 FINDINGS: There is some chronically coarsened interstitial changes and bronchitic features throughout the lungs. No pneumothorax. Suspect small bilateral effusions. Faint patchy opacity is present in the retrocardiac and left lung periphery. The aorta is calcified. The remaining cardiomediastinal contours are unremarkable for portable technique. The osseous structures appear diffusely demineralized which may limit detection of small or nondisplaced fractures. No acute osseous abnormality or suspicious osseous lesion. No acute abnormality of the chest  wall. Surgical clips in the right upper quadrant. Telemetry leads overlie the chest. IMPRESSION: Question some faint patchy opacity in the periphery of the left lung base and retrocardiac space. Could reflect atelectasis or airspace disease. Trace bilateral effusions. Background of diffuse chronically coarsened interstitial and bronchitic features. Aortic Atherosclerosis (ICD10-I70.0). Electronically Signed   By: Lovena Le M.D.   On: 10/07/2020 20:30   ECHOCARDIOGRAM COMPLETE  Result Date: 11/01/2020    ECHOCARDIOGRAM REPORT   Patient Name:   Kaitlyn Clark Date of Exam: 11/01/2020 Medical Rec #:  [id number]         Height:       57.0 in Accession #:    [id number]        Weight:       127.2 lb Date of Birth:  1924/07/05          BSA:          1.484 m Patient Age:    89 years          BP:           150/101 mmHg Patient Gender: F                 HR:           91 bpm. Exam Location:  Inpatient Procedure: 2D Echo, Cardiac Doppler and Color Doppler Indications:    NSTEMI  History:        Patient has no prior history of Echocardiogram examinations.                 Aortic Valve Disease; Risk Factors:Hypertension.  Sonographer:    Cammy Brochure Referring Phys: [id number] [place]  1. Left ventricular ejection fraction, by estimation, is 50 to 55%. The left ventricle has low normal function. The left ventricle has no regional wall motion abnormalities. Left ventricular diastolic parameters are consistent with Grade III diastolic dysfunction (restrictive).  2. Right ventricular systolic function is moderately reduced. The right ventricular  size is normal.  3. The mitral valve is grossly normal. Mild mitral valve regurgitation.  4. The aortic valve is calcified. There is moderate calcification of the aortic valve. There is moderate thickening of the aortic valve. Aortic valve regurgitation is not visualized. No aortic stenosis is present. FINDINGS  Left Ventricle: Left ventricular ejection fraction,  by estimation, is 50 to 55%. The left ventricle has low normal function. The left ventricle has no regional wall motion abnormalities. The left ventricular internal cavity size was normal in size. There is no left ventricular hypertrophy. Left ventricular diastolic parameters are consistent with Grade III diastolic dysfunction (restrictive). Right Ventricle: The right ventricular size is normal. Right vetricular wall thickness was not well visualized. Right ventricular systolic function is moderately reduced. Left Atrium: Left atrial size was normal in size. Right Atrium: Right atrial size was normal in size. Pericardium: There is no evidence of pericardial effusion. Mitral Valve: The mitral valve is grossly normal. Mild mitral valve regurgitation. Tricuspid Valve: The tricuspid valve is grossly normal. Tricuspid valve regurgitation is not demonstrated. Aortic Valve: The aortic valve is calcified. There is moderate calcification of the aortic valve. There is moderate thickening of the aortic valve. There is moderate aortic valve annular calcification. Aortic valve regurgitation is not visualized. No aortic stenosis is present. Aortic valve mean gradient measures 2.0 mmHg. Aortic valve peak gradient measures 3.5 mmHg. Aortic valve area, by VTI measures 1.93 cm. Pulmonic Valve: The pulmonic valve was grossly normal. Pulmonic valve regurgitation is trivial. Aorta: The aortic root and ascending aorta are structurally normal, with no evidence of dilitation. IAS/Shunts: The atrial septum is grossly normal.  LEFT VENTRICLE PLAX 2D LVIDd:         4.40 cm  Diastology LVIDs:         3.50 cm  LV e' medial:    4.57 cm/s LV PW:         0.90 cm  LV E/e' medial:  13.3 LV IVS:        0.90 cm  LV e' lateral:   6.31 cm/s LVOT diam:     1.80 cm  LV E/e' lateral: 9.6 LV SV:         35 LV SV Index:   24 LVOT Area:     2.54 cm  RIGHT VENTRICLE            IVC RV Basal diam:  3.20 cm    IVC diam: 1.60 cm RV S prime:     6.85 cm/s TAPSE  (M-mode): 1.1 cm LEFT ATRIUM             Index       RIGHT ATRIUM           Index LA diam:        3.10 cm 2.09 cm/m  RA Area:     11.60 cm LA Vol (A2C):   44.9 ml 30.26 ml/m RA Volume:   24.60 ml  16.58 ml/m LA Vol (A4C):   44.6 ml 30.05 ml/m LA Biplane Vol: 47.8 ml 32.21 ml/m  AORTIC VALVE AV Area (Vmax):    1.89 cm AV Area (Vmean):   1.84 cm AV Area (VTI):     1.93 cm AV Vmax:           93.80 cm/s AV Vmean:          64.500 cm/s AV VTI:            0.183 m AV Peak Grad:  3.5 mmHg AV Mean Grad:      2.0 mmHg LVOT Vmax:         69.70 cm/s LVOT Vmean:        46.600 cm/s LVOT VTI:          0.139 m LVOT/AV VTI ratio: 0.76  AORTA Ao Root diam: 2.70 cm Ao Asc diam:  3.50 cm MITRAL VALVE MV Area (PHT): 4.15 cm    SHUNTS MV Decel Time: 183 msec    Systemic VTI:  0.14 m MV E velocity: 60.80 cm/s  Systemic Diam: 1.80 cm MV A velocity: 50.30 cm/s MV E/A ratio:  1.21 Mertie Moores MD Electronically signed by Mertie Moores MD Signature Date/Time: 11/01/2020/3:19:50 PM    Final     Microbiology: Recent Results (from the past 240 hour(s))  Resp Panel by RT-PCR (Flu A&B, Covid) Nasopharyngeal Swab     Status: Abnormal   Collection Time: 11/01/20 12:00 AM   Specimen: Nasopharyngeal Swab; Nasopharyngeal(NP) swabs in vial transport medium  Result Value Ref Range Status   SARS Coronavirus 2 by RT PCR POSITIVE (A) NEGATIVE Final    Comment: RESULT CALLED TO, READ BACK BY AND VERIFIED WITH: Charm Rings, RN 11/01/20 @ 0114 BY AV (NOTE) SARS-CoV-2 target nucleic acids are DETECTED.  The SARS-CoV-2 RNA is generally detectable in upper respiratory specimens during the acute phase of infection. Positive results are indicative of the presence of the identified virus, but do not rule out bacterial infection or co-infection with other pathogens not detected by the test. Clinical correlation with patient history and other diagnostic information is necessary to determine patient infection status. The expected result is  Negative.  Fact Sheet for Patients: EntrepreneurPulse.com.au  Fact Sheet for Healthcare Providers: IncredibleEmployment.be  This test is not yet approved or cleared by the [place] FDA and  has been authorized for detection and/or diagnosis of SARS-CoV-2 by FDA under an Emergency Use Authorization (EUA).  This EUA will remain in effect (meaning this test can be  used) for the duration of  the COVID-19 declaration under Section 564(b)(1) of the Act, 21 U.S.C. section 360bbb-3(b)(1), unless the authorization is terminated or revoked sooner.     Influenza A by PCR NEGATIVE NEGATIVE Final   Influenza B by PCR NEGATIVE NEGATIVE Final    Comment: (NOTE) The Xpert Xpress SARS-CoV-2/FLU/RSV plus assay is intended as an aid in the diagnosis of influenza from Nasopharyngeal swab specimens and should not be used as a sole basis for treatment. Nasal washings and aspirates are unacceptable for Xpert Xpress SARS-CoV-2/FLU/RSV testing.  Fact Sheet for Patients: EntrepreneurPulse.com.au  Fact Sheet for Healthcare Providers: IncredibleEmployment.be  This test is not yet approved or cleared by the [place] FDA and has been authorized for detection and/or diagnosis of SARS-CoV-2 by FDA under an Emergency Use Authorization (EUA). This EUA will remain in effect (meaning this test can be used) for the duration of the COVID-19 declaration under Section 564(b)(1) of the Act, 21 U.S.C. section 360bbb-3(b)(1), unless the authorization is terminated or revoked.  Performed at [facility], [address] [place], [place] [address]      Labs: Basic Metabolic Panel: Recent Labs  Lab 10/16/2020 1944 11/01/20 0634 11/02/20 0216  NA 138 137 135  K 3.3* 3.1* 3.2*  CL 99 100 99  CO2 25 25 23   GLUCOSE 130* 121* 138*  BUN 16 15 22   CREATININE 1.30* 1.32* 1.43*  CALCIUM 9.8 8.8* 9.0  MG  --  1.5*   --  Liver Function Tests: Recent Labs  Lab 10/05/2020 1944 11/01/20 0634  AST 103* 131*  ALT 22 31  ALKPHOS 91 84  BILITOT 1.0 1.4*  PROT 7.1 6.2*  ALBUMIN 4.2 3.3*   No results for input(s): LIPASE, AMYLASE in the last 168 hours. No results for input(s): AMMONIA in the last 168 hours. CBC: Recent Labs  Lab 10/16/2020 1944 11/01/20 0634 11/02/20 0216  WBC 19.6* 20.2* 21.5*  NEUTROABS  --  14.9*  --   HGB 12.4 11.9* 11.6*  HCT 36.6 35.7* 34.8*  MCV 89.1 89.3 89.0  PLT 263 220 233   Cardiac Enzymes: No results for input(s): CKTOTAL, CKMB, CKMBINDEX, TROPONINI in the last 168 hours. D-Dimer No results for input(s): DDIMER in the last 72 hours. BNP: Invalid input(s): POCBNP CBG: No results for input(s): GLUCAP in the last 168 hours. Anemia work up No results for input(s): VITAMINB12, FOLATE, FERRITIN, TIBC, IRON, RETICCTPCT in the last 72 hours. Urinalysis No results found for: COLORURINE, APPEARANCEUR, LABSPEC, PHURINE, GLUCOSEU, HGBUR, BILIRUBINUR, KETONESUR, PROTEINUR, UROBILINOGEN, NITRITE, LEUKOCYTESUR Sepsis Labs Invalid input(s): PROCALCITONIN,  WBC,  LACTICIDVEN    SIGNED:  Marylu Lund, MD  [facility] 11/04/2020, 9:07 AM  If 7PM-7AM, please contact night-coverage [email or web address] Password [email or web address]

## 2020-12-02 NOTE — Progress Notes (Signed)
Manufacturing engineer [facility]) Hospital Liaison note.    Received request from [place] for family interest in [facility]. Chart and pt information under review by [facility] physician.  Hospice eligibility pending at this time.  Spoke with daughter Vaughan Basta to answer questions.  Contact information provided.  [facility] is unable to offer a room today. Hospital Liaison will follow up tomorrow or sooner if a room becomes available. Please do not hesitate to call with questions.    Thank you for the opportunity to participate in this patient's care.  Domenic Moras, BSN, RN [facility] Liaison (listed on [facility] under Hospice[facility])    [phone] (24h on call)

## 2020-12-02 NOTE — Progress Notes (Signed)
Received a call from bedside RN regarding the patient passing on 23-Nov-2020 at 2130.  Patient was comfort care and family was present in the room.

## 2020-12-02 DEATH — deceased

## 2022-09-11 IMAGING — CT CT ANGIO CHEST
2 of 7 series · 17 of 46 positions shown · IV contrast (omnipaque)
Comparison: 07/27/2011

CLINICAL DATA: Pleurisy, elevated D-dimer

EXAM:
CT ANGIOGRAPHY CHEST WITH CONTRAST
TECHNIQUE: Multidetector CT imaging of the chest was performed using the
standard protocol during bolus administration of intravenous
contrast. Multiplanar CT image reconstructions and MIPs were
obtained to evaluate the vascular anatomy.
CONTRAST:  50mL OMNIPAQUE IOHEXOL 350 MG/ML SOLN

[Series 6: pe axial thins · axial · 0.64mm/px · z∈[-705,-501]mm · 14 of 236 slices shown]
[im 16/236  lung]
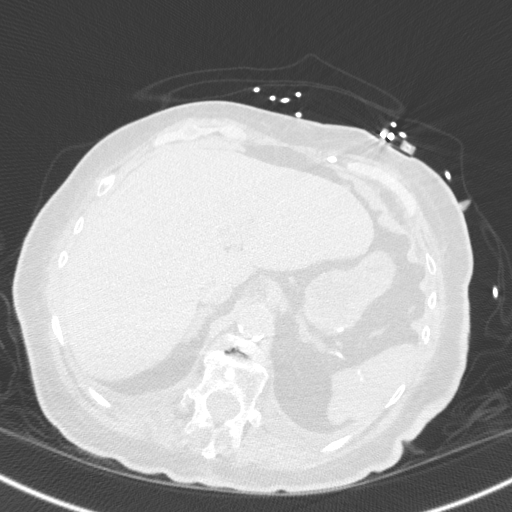
[im 32/236  soft-tissue]
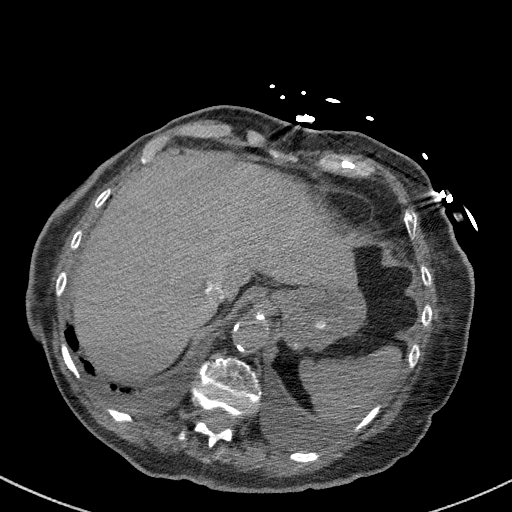
[im 48/236  lung]
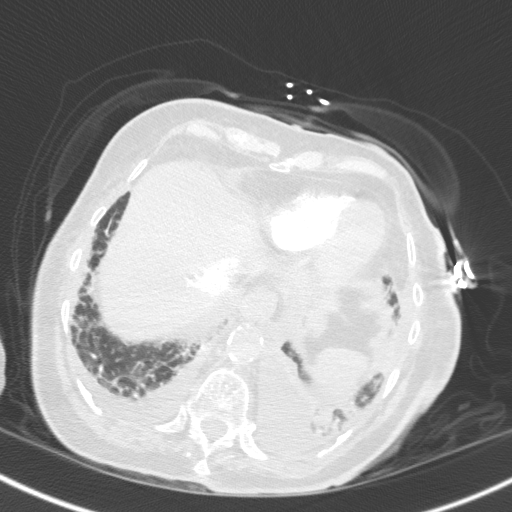
[im 63/236  soft-tissue]
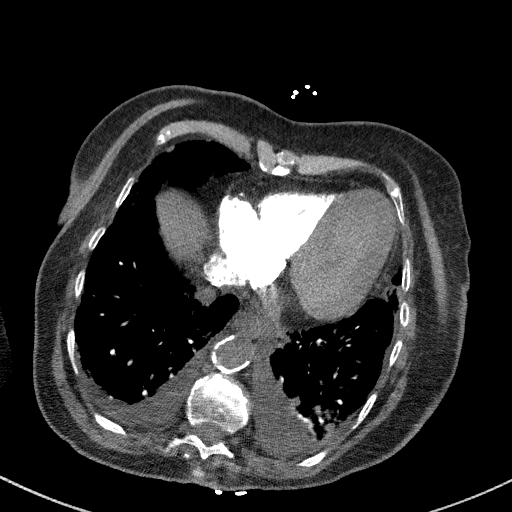
[im 79/236  lung]
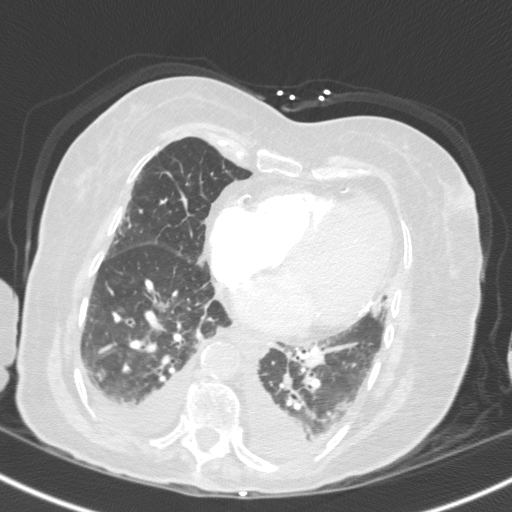
[im 95/236  soft-tissue]
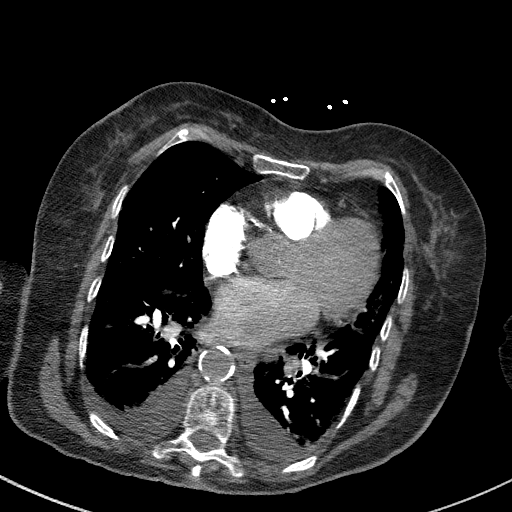
[im 110/236  lung]
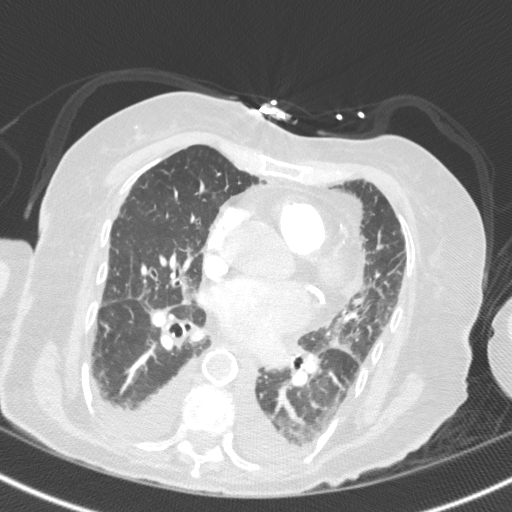
[im 126/236  soft-tissue]
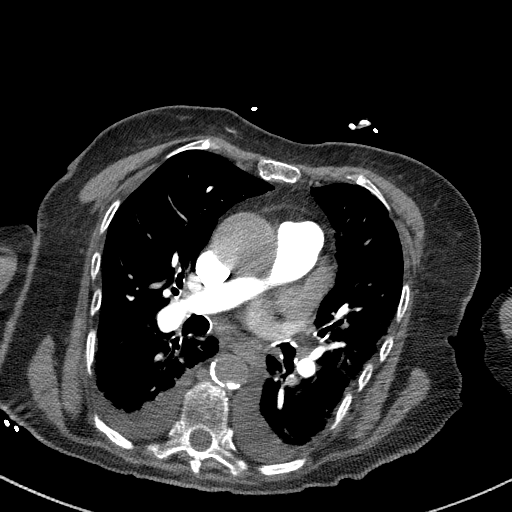
[im 142/236  lung]
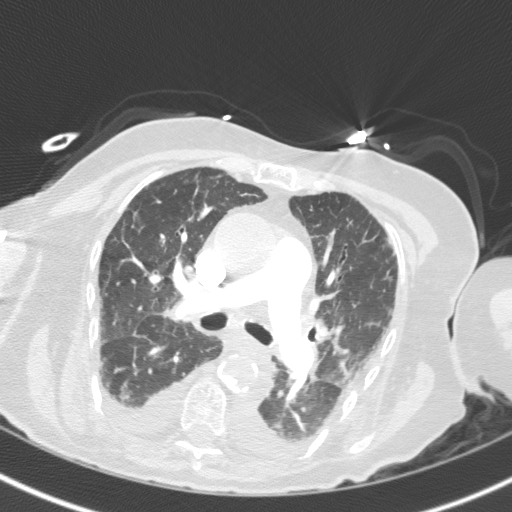
[im 157/236  soft-tissue]
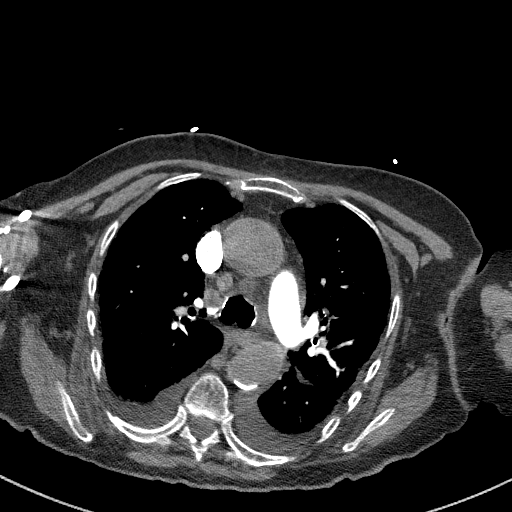
[im 173/236  lung]
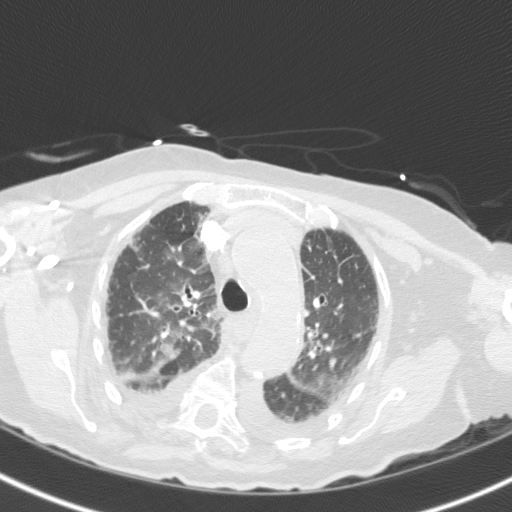
[im 189/236  soft-tissue]
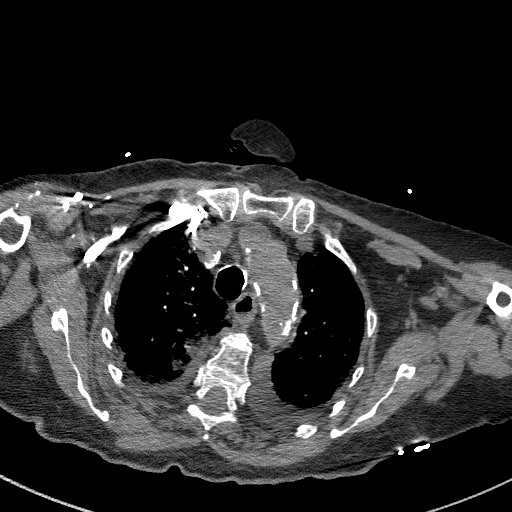
[im 204/236  lung]
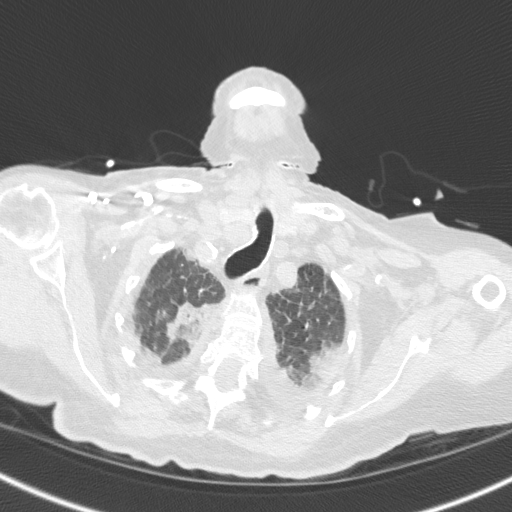
[im 220/236  soft-tissue]
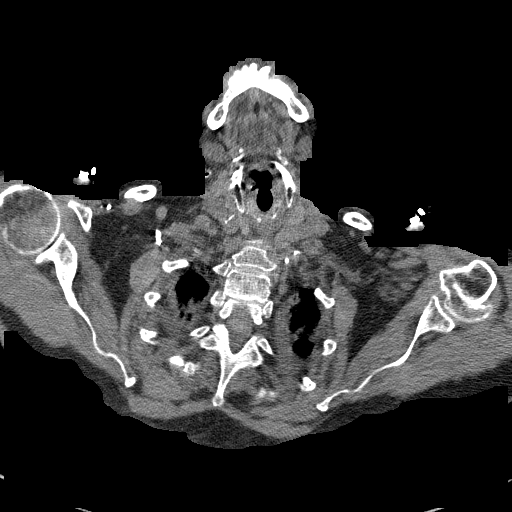

[Series 8: cor soft · coronal · 0.48mm/px · 3 of 119 slices shown]
[im 30/119  soft-tissue]
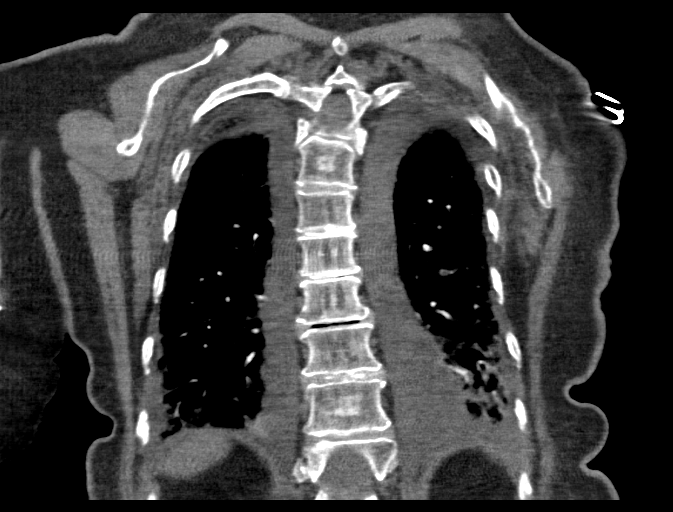
[im 60/119  soft-tissue]
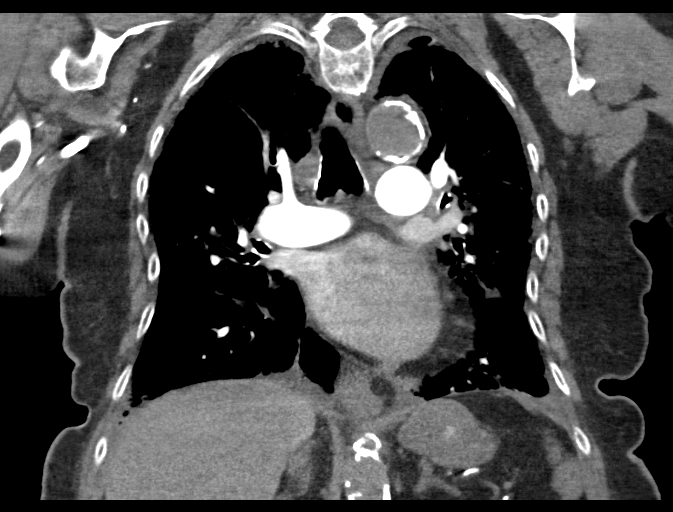
[im 89/119  soft-tissue]
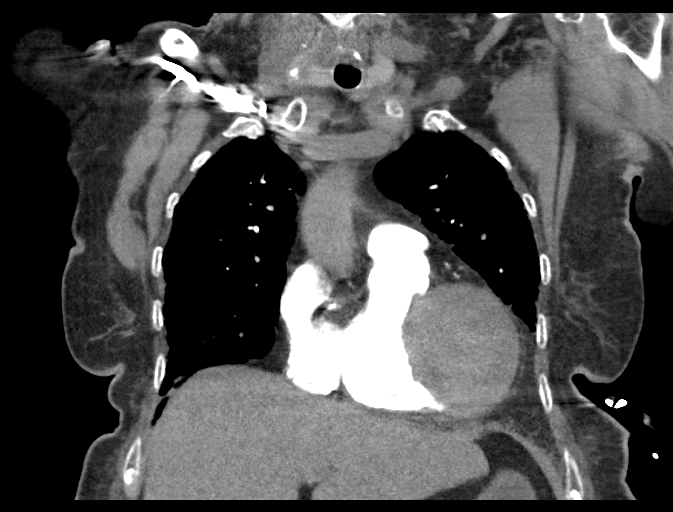

[17 of 46 positions shown; findings below may reference images not displayed]

FINDINGS: Cardiovascular: There is adequate opacification of the pulmonary
arterial tree. There is no intraluminal filling defect identified to
suggest acute pulmonary embolism. The central pulmonary arteries are
of normal caliber.

Extensive multi-vessel coronary artery calcification. Cardiac size
is mildly enlarged with mild left ventricular hypertrophy noted.
Moderate atherosclerotic calcification is seen within the thoracic
aorta. No aortic aneurysm.

Mediastinum/Nodes: The thyroid gland is unremarkable. There is
progressive shotty mediastinal adenopathy without frank pathologic
enlargement, best appreciated within the high right paratracheal,
prevascular, and aortopulmonary lymph node groups. The esophagus is
unremarkable.

Lungs/Pleura: Small bilateral pleural effusions are present. There
is smooth interlobular septal thickening best appreciated at the
lung bases and ground-glass pulmonary infiltrate seen within the
lung apices bilaterally most in keeping with mild pulmonary edema,
likely cardiogenic in nature. No pneumothorax. Central airways are
widely patent.

Upper Abdomen: No acute abnormality.

Musculoskeletal: No acute bone abnormality. No lytic or blastic bone
lesion. Degenerative changes are seen within the thoracic spine.

Review of the MIP images confirms the above findings.
IMPRESSION: No pulmonary embolism.

Extensive multi-vessel coronary artery calcification. Mild left
ventricular hypertrophy.

Small bilateral pleural effusions and mild pulmonary edema most in
keeping with mild cardiogenic failure.

Aortic Atherosclerosis ([id number]).
